# Patient Record
Sex: Female | Born: 1965 | Race: White | Hispanic: No | Smoking: Current every day smoker
Health system: Southern US, Community
[De-identification: ages and names within clinical notes are randomized; demographics above are authoritative.]

## PROBLEM LIST (undated history)

## (undated) DIAGNOSIS — R42 Dizziness and giddiness: Secondary | ICD-10-CM

## (undated) DIAGNOSIS — F3181 Bipolar II disorder: Secondary | ICD-10-CM

## (undated) DIAGNOSIS — F32A Depression, unspecified: Secondary | ICD-10-CM

## (undated) DIAGNOSIS — R945 Abnormal results of liver function studies: Secondary | ICD-10-CM

## (undated) DIAGNOSIS — R7989 Other specified abnormal findings of blood chemistry: Secondary | ICD-10-CM

## (undated) DIAGNOSIS — K219 Gastro-esophageal reflux disease without esophagitis: Secondary | ICD-10-CM

## (undated) HISTORY — PX: APPENDECTOMY: SHX54

## (undated) HISTORY — PX: TUBAL LIGATION: SHX77

## (undated) HISTORY — PX: CARPAL TUNNEL RELEASE: SHX101

---

## 2007-07-06 ENCOUNTER — Emergency Department (HOSPITAL_COMMUNITY): Admission: EM | Admit: 2007-07-06 | Discharge: 2007-07-06 | Payer: Self-pay | Admitting: Emergency Medicine

## 2010-03-02 ENCOUNTER — Emergency Department (HOSPITAL_COMMUNITY): Admission: EM | Admit: 2010-03-02 | Discharge: 2010-03-02 | Payer: Self-pay | Admitting: Emergency Medicine

## 2011-02-03 LAB — URINALYSIS, ROUTINE W REFLEX MICROSCOPIC
Specific Gravity, Urine: 1.012 (ref 1.005–1.030)
pH: 6 (ref 5.0–8.0)

## 2011-08-11 ENCOUNTER — Emergency Department (HOSPITAL_COMMUNITY)
Admission: EM | Admit: 2011-08-11 | Discharge: 2011-08-11 | Disposition: A | Discharge status: Home / Self Care | Payer: Self-pay | Attending: Emergency Medicine | Admitting: Emergency Medicine

## 2011-08-11 ENCOUNTER — Emergency Department (HOSPITAL_COMMUNITY): Payer: Self-pay

## 2011-08-11 DIAGNOSIS — G43909 Migraine, unspecified, not intractable, without status migrainosus: Secondary | ICD-10-CM | POA: Insufficient documentation

## 2011-08-11 DIAGNOSIS — F17200 Nicotine dependence, unspecified, uncomplicated: Secondary | ICD-10-CM | POA: Insufficient documentation

## 2011-08-11 DIAGNOSIS — Z79899 Other long term (current) drug therapy: Secondary | ICD-10-CM | POA: Insufficient documentation

## 2011-08-11 DIAGNOSIS — F411 Generalized anxiety disorder: Secondary | ICD-10-CM | POA: Insufficient documentation

## 2011-08-11 DIAGNOSIS — R079 Chest pain, unspecified: Secondary | ICD-10-CM | POA: Insufficient documentation

## 2011-08-11 LAB — CBC
HCT: 37 % (ref 36.0–46.0)
Hemoglobin: 12.4 g/dL (ref 12.0–15.0)
MCHC: 33.5 g/dL (ref 30.0–36.0)
MCV: 89.4 fL (ref 78.0–100.0)
Platelets: 179 10*3/uL (ref 150–400)

## 2011-08-11 LAB — BASIC METABOLIC PANEL
CO2: 25 mEq/L (ref 19–32)
Calcium: 9 mg/dL (ref 8.4–10.5)
Chloride: 107 mEq/L (ref 96–112)
Potassium: 3.5 mEq/L (ref 3.5–5.1)
Sodium: 139 mEq/L (ref 135–145)

## 2011-08-11 LAB — POCT I-STAT TROPONIN I: Troponin i, poc: 0 ng/mL (ref 0.00–0.08)

## 2011-08-28 LAB — URINALYSIS, ROUTINE W REFLEX MICROSCOPIC
Bilirubin Urine: NEGATIVE
Ketones, ur: NEGATIVE
Nitrite: NEGATIVE
Protein, ur: NEGATIVE
Urobilinogen, UA: 0.2
pH: 6

## 2012-01-06 ENCOUNTER — Emergency Department (HOSPITAL_BASED_OUTPATIENT_CLINIC_OR_DEPARTMENT_OTHER)
Admission: EM | Admit: 2012-01-06 | Discharge: 2012-01-06 | Disposition: A | Discharge status: Home / Self Care | Payer: Self-pay | Attending: Emergency Medicine | Admitting: Emergency Medicine

## 2012-01-06 ENCOUNTER — Encounter (HOSPITAL_BASED_OUTPATIENT_CLINIC_OR_DEPARTMENT_OTHER): Payer: Self-pay | Admitting: *Deleted

## 2012-01-06 ENCOUNTER — Emergency Department (INDEPENDENT_AMBULATORY_CARE_PROVIDER_SITE_OTHER): Payer: Self-pay

## 2012-01-06 ENCOUNTER — Other Ambulatory Visit: Payer: Self-pay

## 2012-01-06 DIAGNOSIS — R9389 Abnormal findings on diagnostic imaging of other specified body structures: Secondary | ICD-10-CM

## 2012-01-06 DIAGNOSIS — J189 Pneumonia, unspecified organism: Secondary | ICD-10-CM

## 2012-01-06 DIAGNOSIS — R059 Cough, unspecified: Secondary | ICD-10-CM | POA: Insufficient documentation

## 2012-01-06 DIAGNOSIS — R0989 Other specified symptoms and signs involving the circulatory and respiratory systems: Secondary | ICD-10-CM

## 2012-01-06 DIAGNOSIS — R05 Cough: Secondary | ICD-10-CM | POA: Insufficient documentation

## 2012-01-06 DIAGNOSIS — R918 Other nonspecific abnormal finding of lung field: Secondary | ICD-10-CM

## 2012-01-06 DIAGNOSIS — R509 Fever, unspecified: Secondary | ICD-10-CM | POA: Insufficient documentation

## 2012-01-06 LAB — BASIC METABOLIC PANEL
CO2: 28 mEq/L (ref 19–32)
Chloride: 105 mEq/L (ref 96–112)
Creatinine, Ser: 0.7 mg/dL (ref 0.50–1.10)
Potassium: 3.9 mEq/L (ref 3.5–5.1)
Sodium: 142 mEq/L (ref 135–145)

## 2012-01-06 LAB — DIFFERENTIAL
Basophils Absolute: 0 10*3/uL (ref 0.0–0.1)
Basophils Relative: 0 % (ref 0–1)
Eosinophils Absolute: 0.1 10*3/uL (ref 0.0–0.7)
Eosinophils Relative: 1 % (ref 0–5)
Monocytes Relative: 10 % (ref 3–12)
Neutro Abs: 3.7 10*3/uL (ref 1.7–7.7)

## 2012-01-06 LAB — CBC
Hemoglobin: 14 g/dL (ref 12.0–15.0)
MCHC: 33.7 g/dL (ref 30.0–36.0)
MCV: 87.8 fL (ref 78.0–100.0)
RBC: 4.74 MIL/uL (ref 3.87–5.11)

## 2012-01-06 MED ORDER — DEXTROSE 5 % IV SOLN
1.0000 g | INTRAVENOUS | Status: DC
  Administered 2012-01-06: 1 g via INTRAVENOUS
  Filled 2012-01-06: qty 10

## 2012-01-06 MED ORDER — AZITHROMYCIN 250 MG PO TABS: ORAL_TABLET | ORAL | Status: DC

## 2012-01-06 MED ORDER — SODIUM CHLORIDE 0.9 % IV SOLN
Freq: Once | INTRAVENOUS | Status: AC
  Administered 2012-01-06: 15:00:00 via INTRAVENOUS

## 2012-01-06 MED ORDER — ONDANSETRON HCL 4 MG/2ML IJ SOLN
4.0000 mg | Freq: Once | INTRAMUSCULAR | Status: AC
  Administered 2012-01-06: 4 mg via INTRAVENOUS
  Filled 2012-01-06: qty 2

## 2012-01-06 MED ORDER — AZITHROMYCIN 500 MG IV SOLR
500.0000 mg | Freq: Once | INTRAVENOUS | Status: AC
  Administered 2012-01-06: 500 mg via INTRAVENOUS
  Filled 2012-01-06: qty 500

## 2012-01-06 MED ORDER — ACETAMINOPHEN 325 MG PO TABS
ORAL_TABLET | ORAL | Status: AC
  Administered 2012-01-06: 650 mg
  Filled 2012-01-06: qty 2

## 2012-01-06 NOTE — ED Provider Notes (Signed)
History     CSN: [id number]  Arrival date & time 01/06/12  1337   None     Chief Complaint  Patient presents with  . URI    (Consider location/radiation/quality/duration/timing/severity/associated sxs/prior treatment) Patient is a 46 y.o. female presenting with cough. The history is provided by the patient. No language interpreter was used.  Cough This is a new problem. The current episode started more than 1 week ago. The problem occurs constantly. The problem has been gradually worsening. The cough is productive of sputum. The maximum temperature recorded prior to her arrival was 100 to 100.9 F. The fever has been present for less than 1 day. She has tried nothing for the symptoms. The treatment provided no relief. Her past medical history does not include bronchitis or pneumonia.  Pt complains of cough and bodyaches.   Pt reports she aches all over.  History reviewed. No pertinent past medical history.  Past Surgical History  Procedure Date  . Appendectomy   . Cesarean section   . Tubal ligation     History reviewed. No pertinent family history.  History  Substance Use Topics  . Smoking status: Never Smoker   . Smokeless tobacco: Not on file  . Alcohol Use: No    OB History    Grav Para Term Preterm Abortions TAB SAB Ect Mult Living                  Review of Systems  Respiratory: Positive for cough.   All other systems reviewed and are negative.    Allergies  Review of patient's allergies indicates no known allergies.  Home Medications  No current outpatient prescriptions on file.  BP 140/85  Pulse 81  Temp(Src) 98.1 F (36.7 C) (Oral)  Resp 16  Ht 5\' 3"  (1.6 m)  Wt 200 lb (90.719 kg)  BMI 35.43 kg/m2  SpO2 100%  LMP 12/23/2011  Physical Exam  Nursing note and vitals reviewed. Constitutional: She appears well-developed and well-nourished.  HENT:  Head: Normocephalic and atraumatic.  Right Ear: External ear normal.  Nose: Nose normal.    Mouth/Throat: Oropharynx is clear and moist.  Eyes: Conjunctivae and EOM are normal. Pupils are equal, round, and reactive to light.  Neck: Normal range of motion. Neck supple.  Cardiovascular: Normal rate and normal heart sounds.   Pulmonary/Chest: Effort normal and breath sounds normal.  Abdominal: Soft. Bowel sounds are normal.  Musculoskeletal: Normal range of motion.  Neurological: She is alert.  Skin: Skin is warm.  Psychiatric: She has a normal mood and affect.    ED Course  Procedures (including critical care time)  Labs Reviewed - No data to display No results found.   No diagnosis found.    MDM  Chest xray shows infiltrate,   Pt given IV Rocephin and zithromax.   Pt advised to see her MD for recheck in 2 days. Pt given rx for zithromax. Pt is advised to return if shortness of breath or fever not relieved by tylenol   Date: 01/06/2012  Rate: 73  Rhythm: normal sinus rhythm  QRS Axis: normal  Intervals: normal  ST/T Wave abnormalities: nonspecific ST changes  Conduction Disutrbances:none  Narrative Interpretation:   Old EKG Reviewed: unchanged       Langston Masker, PA 01/06/12 1815  Langston Masker, [place] 01/06/12 [phone]

## 2012-01-06 NOTE — Discharge Instructions (Signed)

## 2012-01-06 NOTE — ED Notes (Signed)
Pt c/o URI symptoms x 2 weeks.  

## 2012-01-07 NOTE — ED Provider Notes (Signed)
History/physical exam/procedure(s) were performed by non-physician practitioner and as supervising physician I was immediately available for consultation/collaboration. I have reviewed all notes and am in agreement with care and plan.   Hilario Quarry, MD 01/07/12 [phone]

## 2012-02-01 ENCOUNTER — Encounter (HOSPITAL_BASED_OUTPATIENT_CLINIC_OR_DEPARTMENT_OTHER): Payer: Self-pay | Admitting: Family Medicine

## 2012-02-01 ENCOUNTER — Other Ambulatory Visit: Payer: Self-pay

## 2012-02-01 ENCOUNTER — Emergency Department (INDEPENDENT_AMBULATORY_CARE_PROVIDER_SITE_OTHER): Payer: Self-pay

## 2012-02-01 ENCOUNTER — Emergency Department (HOSPITAL_BASED_OUTPATIENT_CLINIC_OR_DEPARTMENT_OTHER)
Admission: EM | Admit: 2012-02-01 | Discharge: 2012-02-01 | Disposition: A | Discharge status: Home Health | Payer: Self-pay | Attending: Emergency Medicine | Admitting: Emergency Medicine

## 2012-02-01 DIAGNOSIS — F17200 Nicotine dependence, unspecified, uncomplicated: Secondary | ICD-10-CM | POA: Insufficient documentation

## 2012-02-01 DIAGNOSIS — R079 Chest pain, unspecified: Secondary | ICD-10-CM | POA: Insufficient documentation

## 2012-02-01 DIAGNOSIS — J40 Bronchitis, not specified as acute or chronic: Secondary | ICD-10-CM | POA: Insufficient documentation

## 2012-02-01 DIAGNOSIS — K2970 Gastritis, unspecified, without bleeding: Secondary | ICD-10-CM | POA: Insufficient documentation

## 2012-02-01 DIAGNOSIS — R0602 Shortness of breath: Secondary | ICD-10-CM

## 2012-02-01 DIAGNOSIS — K2990 Gastroduodenitis, unspecified, without bleeding: Secondary | ICD-10-CM | POA: Insufficient documentation

## 2012-02-01 DIAGNOSIS — K219 Gastro-esophageal reflux disease without esophagitis: Secondary | ICD-10-CM | POA: Insufficient documentation

## 2012-02-01 HISTORY — DX: Gastro-esophageal reflux disease without esophagitis: K21.9

## 2012-02-01 LAB — CARDIAC PANEL(CRET KIN+CKTOT+MB+TROPI)
Relative Index: INVALID (ref 0.0–2.5)
Total CK: 94 U/L (ref 7–177)
Troponin I: <0.3 ng/mL (ref <0.30)

## 2012-02-01 LAB — CBC
MCH: 29.9 pg (ref 26.0–34.0)
MCHC: 34.3 g/dL (ref 30.0–36.0)
MCV: 87.2 fL (ref 78.0–100.0)
Platelets: 196 10*3/uL (ref 150–400)
WBC: 6 10*3/uL (ref 4.0–10.5)

## 2012-02-01 LAB — COMPREHENSIVE METABOLIC PANEL
ALT: 42 U/L — ABNORMAL HIGH (ref 0–35)
Albumin: 4.1 g/dL (ref 3.5–5.2)
CO2: 26 mEq/L (ref 19–32)
Creatinine, Ser: 0.6 mg/dL (ref 0.50–1.10)
GFR calc Af Amer: >90 mL/min (ref >90)
GFR calc non Af Amer: >90 mL/min (ref >90)
Sodium: 140 mEq/L (ref 135–145)

## 2012-02-01 MED ORDER — ALBUTEROL SULFATE HFA 108 (90 BASE) MCG/ACT IN AERS
2.0000 | INHALATION_SPRAY | Freq: Once | RESPIRATORY_TRACT | Status: AC
  Administered 2012-02-01: 2 via RESPIRATORY_TRACT
  Filled 2012-02-01: qty 6.7

## 2012-02-01 MED ORDER — GI COCKTAIL ~~LOC~~
30.0000 mL | Freq: Once | ORAL | Status: AC
  Administered 2012-02-01: 30 mL via ORAL
  Filled 2012-02-01: qty 30

## 2012-02-01 MED ORDER — ASPIRIN 81 MG PO CHEW
324.0000 mg | CHEWABLE_TABLET | Freq: Once | ORAL | Status: AC
  Administered 2012-02-01: 324 mg via ORAL
  Filled 2012-02-01: qty 4

## 2012-02-01 MED ORDER — PANTOPRAZOLE SODIUM 20 MG PO TBEC: 20.0000 mg | DELAYED_RELEASE_TABLET | Freq: Every day | ORAL | Status: AC

## 2012-02-01 NOTE — ED Notes (Signed)
Karen Sofia, PA-C at bedside speaking with pt. 

## 2012-02-01 NOTE — ED Notes (Signed)
Patient ambulatory to the restroom with standby assistance.  Gait steady.

## 2012-02-01 NOTE — ED Notes (Signed)
Family at bedside. 

## 2012-02-01 NOTE — Discharge Instructions (Signed)
Cardiac Biomarkers Cardiac biomarkers are enzymes, proteins, and hormones that are associated with heart function, damage or failure. Some of the tests are specific for the heart while others are also elevated with skeletal muscle damage. Cardiac biomarkers are used for diagnostic and prognostic purposes and are frequently ordered by caregivers when someone comes into the Emergency Room complaining of symptoms, such as chest pain, pressure, nausea, and shortness of breath. These tests are ordered, along with other laboratory and non-laboratory tests, to detect heart failure (which is often a chronic, progressive condition affecting the ability of the heart to fill with blood and pump efficiently) and the acute coronary syndromes (ACS) as well as to help determine prognosis for people who have had a heart attack. ACS is a group of symptoms that reflect a sudden decrease in the amount of blood and oxygen, also termed 'ischemia,' reaching the heart. This decrease is frequently due to either a narrowing of the coronary arteries (atherosclerosis or vessel spasm) or unstable plaques, which can cause a blood clot (thrombus) and blockage of blood flow. If the oxygen supply is low, it can cause angina (pain); if blood flow is reduced, it can cause death of heart cells (called myocardial infarction or heart attack) and can lead to death of the affected heart muscle cells and to permanent damage and scarring of the heart.  The goal with cardiac biomarkers is to be able to detect the presence and severity of an acute heart condition as soon as possible so that appropriate treatment can be initiated.  There are only a few cardiac biomarkers that are being routinely used by physicians. Some have been phased out because they are not as specific as the marker of choice - troponin. Many other potential cardiac biomarkers are still being researched but their clinical utility has yet to be established.  Note: Cardiac biomarkers  are not the same tests as those that are used to screen the general healthy population for their risk of developing heart disease. Those can be found under Cardiac Risk Assessment. LABORATORY TESTS CURRENT CARDIAC BIOMARKERS   CK and CK-MB   BNP or (NT-proBNP)   Troponin   Myoglobin (not always used; sometimes ordered with troponin)  MORE GENERAL TESTS FREQUENTLY ORDERED ALONG WITH CARDIAC BIOMARKERS   Blood Gases   CMP   BMP   Electrolytes   CBC  ON THE HORIZON Ischemia modified albumin (IMA) - Test has received FDA approval for use with troponin and electrocardiogram to rule out acute coronary syndrome (ACS) in patients with chest pain. May become useful for identifying patients at higher risk of heart attack and potentially could replace myoglobin one day.  NON-LABORATORY TESTS These tests allow caregivers to look at the size, shape, and function of the heart as it is beating. They can be used to detect changes to the rhythm of the heart as well as to detect and evaluate damaged tissues and blocked arteries.   EKG (ECG, electrocardiogram)   Coronary angiography (or arteriography)   Stress testing   Nuclear scan   ECG (echocardiogram)   Chest X-ray  THE FOLLOWING SUMMARIZES CURRENTLY USED CARDIAC BIOMARKERS. Marker: CK  What: Enzyme that exists in three different isoforms   Where Found: Heart, brain, and skeletal muscle   What Indicates: Injury to muscle cells   Time to Increase: 4 to 6 hours after injury, peaks in 18 to 24 hours   Time back to Normal: Normal in 48 to 72 hours, unless due to continuing injury  When/How Used: Being phased out, may be ordered prior to CK-MB  Marker: CK-MB  What: Heart- related portion of total CK enzyme   Where Found: Heart primarily, but also in skeletal muscle   What Indicates: Injury (cell death) to heart   Time to Increase: 4 to 6 hrs after heart attack, peaks in 12 to 20 hours   Time back to Normal: Returns to normal  in 24 to 48 hours unless new/continual damage   When/How Used: Not as specific as Troponin for heart injury/attack, may be ordered when Troponin is not available, may be ordered to monitor new/continuing damage  Marker: Myoglobin  What: Small oxygen-storing protein   Where Found: Heart and other muscle cells   What Indicates: Injury to heart or other muscle cells. Also elevated with kidney problems.   Time to Increase: Starts to rise within 2 to 3 hours, peaks in 8 to 12 hours.   Time back to Normal: Falls back to normal by about one day after injury occurred   When/How Used: Ordered along with Troponin, helps diagnose heart injury/attack  Marker: Cardiac Troponin  What: Components of a Regulatory protein complex. Two cardiac specific isoforms: T and I   Where Found: Heart muscle   What Indicates: Heart injury/damage   Time to Increase: 4 to 8 hours   Time back to Normal: Remains elevated for 7 to 14 days   When/How Used: Ordered to help assess prognosis and diagnose heart attack  Marker: LDH  What: Enzyme   Where Found: Almost all body tissues   What Indicates: General marker of injury to cells   When/How Used: Phased out, not specific  Marker: AST  What: Enzyme   Where Found: Almost all body tissues   What Indicates: General marker of injury to cells   When/How Used: Phased out, not specific  Marker: Hs-CRP  What: Protein   Where Found: Associated with athero-sclerosis   What Indicates: Inflammatory process   Time back to Normal: Elevated with inflammation   When/How Used: May help determine prognosis of patients who have had heart attack  Marker: BNP  What: Hormone   Where Found: Heart's left ventricle   What Indicates: Heart failure   Time back to Normal: Elevation related to severity   When/How Used: Help diagnose and evaluate heart failure, prognosis, and to monitor therapy  Document Released: 11/25/2004 Document Revised: 10/22/2011 Document  Reviewed: 08/12/2005 [facility] Patient Information 2012 [place], [place].Bronchitis Bronchitis is the body's way of reacting to injury and/or infection (inflammation) of the bronchi. Bronchi are the air tubes that extend from the windpipe into the lungs. If the inflammation becomes severe, it may cause shortness of breath. CAUSES  Inflammation may be caused by:  A virus.   Germs (bacteria).   Dust.   Allergens.   Pollutants and many other irritants.  The cells lining the bronchial tree are covered with tiny hairs (cilia). These constantly beat upward, away from the lungs, toward the mouth. This keeps the lungs free of pollutants. When these cells become too irritated and are unable to do their job, mucus begins to develop. This causes the characteristic cough of bronchitis. The cough clears the lungs when the cilia are unable to do their job. Without either of these protective mechanisms, the mucus would settle in the lungs. Then you would develop pneumonia. Smoking is a common cause of bronchitis and can contribute to pneumonia. Stopping this habit is the single most important thing you can do to help  yourself. TREATMENT   Your caregiver may prescribe an antibiotic if the cough is caused by bacteria. Also, medicines that open up your airways make it easier to breathe. Your caregiver may also recommend or prescribe an expectorant. It will loosen the mucus to be coughed up. Only take over-the-counter or prescription medicines for pain, discomfort, or fever as directed by your caregiver.   Removing whatever causes the problem (smoking, for example) is critical to preventing the problem from getting worse.   Cough suppressants may be prescribed for relief of cough symptoms.   Inhaled medicines may be prescribed to help with symptoms now and to help prevent problems from returning.   For those with recurrent (chronic) bronchitis, there may be a need for steroid medicines.  SEEK IMMEDIATE  MEDICAL CARE IF:   During treatment, you develop more pus-like mucus (purulent sputum).   You have a fever.   Your baby is older than 3 months with a rectal temperature of 102 F (38.9 C) or higher.   Your baby is 42 months old or younger with a rectal temperature of 100.4 F (38 C) or higher.   You become progressively more ill.   You have increased difficulty breathing, wheezing, or shortness of breath.  It is necessary to seek immediate medical care if you are elderly or sick from any other disease. MAKE SURE YOU:   Understand these instructions.   Will watch your condition.   Will get help right away if you are not doing well or get worse.  Document Released: 11/02/2005 Document Revised: 10/22/2011 Document Reviewed: 09/11/2008 [facility] Patient Information 2012 [place], [place].Chest Pain (Nonspecific) It is often hard to give a specific diagnosis for the cause of chest pain. There is always a chance that your pain could be related to something serious, such as a heart attack or a blood clot in the lungs. You need to follow up with your caregiver for further evaluation. CAUSES   Heartburn.   Pneumonia or bronchitis.   Anxiety or stress.   Inflammation around your heart (pericarditis) or lung (pleuritis or pleurisy).   A blood clot in the lung.   A collapsed lung (pneumothorax). It can develop suddenly on its own (spontaneous pneumothorax) or from injury (trauma) to the chest.   Shingles infection (herpes zoster virus).  The chest wall is composed of bones, muscles, and cartilage. Any of these can be the source of the pain.  The bones can be bruised by injury.   The muscles or cartilage can be strained by coughing or overwork.   The cartilage can be affected by inflammation and become sore (costochondritis).  DIAGNOSIS  Lab tests or other studies, such as X-rays, electrocardiography, stress testing, or cardiac imaging, may be needed to find the cause of your pain.    TREATMENT   Treatment depends on what may be causing your chest pain. Treatment may include:   Acid blockers for heartburn.   Anti-inflammatory medicine.   Pain medicine for inflammatory conditions.   Antibiotics if an infection is present.   You may be advised to change lifestyle habits. This includes stopping smoking and avoiding alcohol, caffeine, and chocolate.   You may be advised to keep your head raised (elevated) when sleeping. This reduces the chance of acid going backward from your stomach into your esophagus.   Most of the time, nonspecific chest pain will improve within 2 to 3 days with rest and mild pain medicine.  HOME CARE INSTRUCTIONS   If antibiotics were  prescribed, take your antibiotics as directed. Finish them even if you start to feel better.   For the next few days, avoid physical activities that bring on chest pain. Continue physical activities as directed.   Do not smoke.   Avoid drinking alcohol.   Only take over-the-counter or prescription medicine for pain, discomfort, or fever as directed by your caregiver.   Follow your caregiver's suggestions for further testing if your chest pain does not go away.   Keep any follow-up appointments you made. If you do not go to an appointment, you could develop lasting (chronic) problems with pain. If there is any problem keeping an appointment, you must call to reschedule.  SEEK MEDICAL CARE IF:   You think you are having problems from the medicine you are taking. Read your medicine instructions carefully.   Your chest pain does not go away, even after treatment.   You develop a rash with blisters on your chest.  SEEK IMMEDIATE MEDICAL CARE IF:   You have increased chest pain or pain that spreads to your arm, neck, jaw, back, or abdomen.   You develop shortness of breath, an increasing cough, or you are coughing up blood.   You have severe back or abdominal pain, feel nauseous, or vomit.   You develop  severe weakness, fainting, or chills.   You have a fever.  THIS IS AN EMERGENCY. Do not wait to see if the pain will go away. Get medical help at once. Call your local emergency services (911 in [place] Do not drive yourself to the hospital. MAKE SURE YOU:   Understand these instructions.   Will watch your condition.   Will get help right away if you are not doing well or get worse.  Document Released: 08/12/2005 Document Revised: 10/22/2011 Document Reviewed: 06/07/2008 [facility] Patient Information 2012 [place], [place].

## 2012-02-01 NOTE — ED Provider Notes (Signed)
History     CSN: [id number]  Arrival date & time 02/01/12  1148   First MD Initiated Contact with Patient 02/01/12 1240      Chief Complaint  Patient presents with  . Chest Pain    (Consider location/radiation/quality/duration/timing/severity/associated sxs/prior treatment) Patient is a 46 y.o. female presenting with chest pain. The history is provided by the patient. No language interpreter was used.  Chest Pain The chest pain began more than 2 weeks ago. Chest pain occurs intermittently. The chest pain is unchanged. The pain is associated with exertion. At its most intense, the pain is at 10/10. The pain is currently at 10/10. The severity of the pain is severe. The quality of the pain is described as aching. The pain does not radiate. Chest pain is worsened by certain positions.   The patient reports she began having pain in her chest 2 weeks ago.  Pt reports she has a bubbling sensationon left side.  Pt complains of tightness in mid chest on and off for 2 weeks.  Pt reports she was anxious about the drive here and pain was worse.  Pt reports her daughters driving upsets her  Past Medical History  Diagnosis Date  . GERD (gastroesophageal reflux disease)     Past Surgical History  Procedure Date  . Appendectomy   . Cesarean section   . Tubal ligation     No family history on file.  History  Substance Use Topics  . Smoking status: Current Everyday Smoker  . Smokeless tobacco: Not on file  . Alcohol Use: No    OB History    Grav Para Term Preterm Abortions TAB SAB Ect Mult Living                  Review of Systems  Cardiovascular: Positive for chest pain.  All other systems reviewed and are negative.    Allergies  Review of patient's allergies indicates no known allergies.  Home Medications   Current Outpatient Rx  Name Route Sig Dispense Refill  . LEXAPRO PO Oral Take by mouth.    Marland Kitchen NAPROSYN PO Oral Take by mouth.    . ACETAMINOPHEN 325 MG PO TABS Oral  Take 650 mg by mouth every 6 (six) hours as needed. Patient used this medication for pain.    Marland Kitchen AZITHROMYCIN 250 MG PO TABS  1 every day until finished. 4 tablet 0  . DM-GUAIFENESIN ER 30-600 MG PO TB12 Oral Take 1 tablet by mouth every 12 (twelve) hours. Patient used this medication for sinus congestion.    . IBUPROFEN 200 MG PO TABS Oral Take 400 mg by mouth every 6 (six) hours as needed. Patient used this medication for pain.    Marland Kitchen SODIUM & POTASSIUM BICARBONATE PO TBEF Oral Take 2 tablets by mouth daily as needed. Patient used this medication for cold.    Marland Kitchen SUDAFED SINUS NIGHTTIME PO Oral Take 2 tablets by mouth daily as needed. Patient used this medication for sinus congestion.      BP 147/85  Pulse 72  Temp(Src) 98.2 F (36.8 C) (Oral)  Resp 16  Ht 5\' 3"  (1.6 m)  Wt 196 lb (88.905 kg)  BMI 34.72 kg/m2  SpO2 99%  LMP 01/07/2012  Physical Exam  Nursing note and vitals reviewed. Constitutional: She appears well-developed and well-nourished.  HENT:  Head: Normocephalic and atraumatic.  Eyes: Conjunctivae and EOM are normal. Pupils are equal, round, and reactive to light.  Neck: Normal range of motion. Neck  supple.  Cardiovascular: Normal rate.   Pulmonary/Chest: Effort normal.  Abdominal: Soft.  Musculoskeletal: Normal range of motion.  Neurological: She is alert.  Skin: Skin is warm.  Psychiatric: She has a normal mood and affect.    ED Course  Procedures (including critical care time)  Labs Reviewed  COMPREHENSIVE METABOLIC PANEL - Abnormal; Notable for the following:    Glucose, Bld 101 (*)    ALT 42 (*)    All other components within normal limits  CBC  CARDIAC PANEL(CRET KIN+CKTOT+MB+TROPI)   Dg Chest 2 View  02/01/2012  *RADIOLOGY REPORT*  Clinical Data: Chest pain and shortness of breath  CHEST - 2 VIEW  Comparison: 01/14/2012  Findings: Artifact overlies chest.  Heart size is normal. Mediastinal shadows are normal.  There may be mild central bronchial  thickening but there is no infiltrate, collapse or effusion.  No significant bony finding.  IMPRESSION: No pneumonia or collapse.  Possible central bronchial thickening.  Original Report Authenticated By: Thomasenia Sales, M.D.     No diagnosis found.    MDM      Results for orders placed during the hospital encounter of 02/01/12  CBC      Component Value Range   WBC 6.0  4.0 - 10.5 (K/uL)   RBC 4.61  3.87 - 5.11 (MIL/uL)   Hemoglobin 13.8  12.0 - 15.0 (g/dL)   HCT 16.1  09.6 - 04.5 (%)   MCV 87.2  78.0 - 100.0 (fL)   MCH 29.9  26.0 - 34.0 (pg)   MCHC 34.3  30.0 - 36.0 (g/dL)   RDW 40.9  81.1 - 91.4 (%)   Platelets 196  150 - 400 (K/uL)  COMPREHENSIVE METABOLIC PANEL      Component Value Range   Sodium 140  135 - 145 (mEq/L)   Potassium 4.1  3.5 - 5.1 (mEq/L)   Chloride 105  96 - 112 (mEq/L)   CO2 26  19 - 32 (mEq/L)   Glucose, Bld 101 (*) 70 - 99 (mg/dL)   BUN 15  6 - 23 (mg/dL)   Creatinine, Ser 7.82  0.50 - 1.10 (mg/dL)   Calcium 9.7  8.4 - 95.6 (mg/dL)   Total Protein 7.0  6.0 - 8.3 (g/dL)   Albumin 4.1  3.5 - 5.2 (g/dL)   AST 36  0 - 37 (U/L)   ALT 42 (*) 0 - 35 (U/L)   Alkaline Phosphatase 96  39 - 117 (U/L)   Total Bilirubin 0.3  0.3 - 1.2 (mg/dL)   GFR calc non Af Amer >90  >90 (mL/min)   GFR calc Af Amer >90  >90 (mL/min)  CARDIAC PANEL(CRET KIN+CKTOT+MB+TROPI)      Component Value Range   Total CK 94  7 - 177 (U/L)   CK, MB 1.5  0.3 - 4.0 (ng/mL)   Troponin I <0.30  <0.30 (ng/mL)   Relative Index RELATIVE INDEX IS INVALID  0.0 - 2.5   D-DIMER, QUANTITATIVE      Component Value Range   D-Dimer, Quant <0.22  0.00 - 0.48 (ug/mL-FEU)   Dg Chest 2 View  02/01/2012  *RADIOLOGY REPORT*  Clinical Data: Chest pain and shortness of breath  CHEST - 2 VIEW  Comparison: 01/14/2012  Findings: Artifact overlies chest.  Heart size is normal. Mediastinal shadows are normal.  There may be mild central bronchial thickening but there is no infiltrate, collapse or effusion.   No significant bony finding.  IMPRESSION: No pneumonia or collapse.  Possible central bronchial thickening.  Original Report Authenticated By: Thomasenia Sales, M.D.   Dg Chest 2 View  01/06/2012  *RADIOLOGY REPORT*  Clinical Data: Coughing.  Congestion.  Fever.  Previous history given of smoking.  CHEST - 2 VIEW  Comparison: 08/11/2011.  Findings: Cardiac silhouette is upper range normal size.  It is stable.  Mediastinal and hilar contours appear stable.  There is slight elevation of the right hemidiaphragm.  On the PA examination appears to be patchy infiltrate in the right cardiophrenic angle region.  No consolidation is seen.  On the lateral image there is slight increase in perihilar markings with minimal central peribronchial thickening.  No pleural effusion is seen.  There is minimal degenerative spondylosis.  IMPRESSION: Patchy infiltrate in right cardiophrenic angle region with increase in perihilar markings and central peribronchial thickening.  This may be associated with bronchitis, asthma, and reactive airway disease.  No consolidation or pleural effusion is evident.  Original Report Authenticated By: Crawford Givens, M.D.    Pt given Gi cocktail.  I will start prilosec.   I also gave pt an albuterol inhaler,  I advised stop smoking.      Lonia Skinner [place], [place] 02/01/12 2022

## 2012-02-01 NOTE — ED Notes (Signed)
Pt c/o central chest pain intermittent and present with movement. Pt sts pain feels like pins and needles. Pt sts she has reflux and gas "problems". Pt sts she also took a xanax without relief. Pt denies pain at present and sts "it was really bad on the drive here because the traffic was so bad", pt questions whether it might be "stress related".

## 2012-02-02 NOTE — ED Provider Notes (Signed)
Medical screening examination/treatment/procedure(s) were performed by non-physician practitioner and as supervising physician I was immediately available for consultation/collaboration.   Clemon Devaul, MD 02/02/12 0703 

## 2013-02-13 ENCOUNTER — Emergency Department (HOSPITAL_BASED_OUTPATIENT_CLINIC_OR_DEPARTMENT_OTHER)
Admission: EM | Admit: 2013-02-13 | Discharge: 2013-02-13 | Disposition: A | Discharge status: Home / Self Care | Payer: Self-pay | Attending: Emergency Medicine | Admitting: Emergency Medicine

## 2013-02-13 ENCOUNTER — Encounter (HOSPITAL_BASED_OUTPATIENT_CLINIC_OR_DEPARTMENT_OTHER): Payer: Self-pay | Admitting: *Deleted

## 2013-02-13 DIAGNOSIS — K219 Gastro-esophageal reflux disease without esophagitis: Secondary | ICD-10-CM | POA: Insufficient documentation

## 2013-02-13 DIAGNOSIS — J3489 Other specified disorders of nose and nasal sinuses: Secondary | ICD-10-CM | POA: Insufficient documentation

## 2013-02-13 DIAGNOSIS — R42 Dizziness and giddiness: Secondary | ICD-10-CM | POA: Insufficient documentation

## 2013-02-13 DIAGNOSIS — F17200 Nicotine dependence, unspecified, uncomplicated: Secondary | ICD-10-CM | POA: Insufficient documentation

## 2013-02-13 DIAGNOSIS — H7290 Unspecified perforation of tympanic membrane, unspecified ear: Secondary | ICD-10-CM | POA: Insufficient documentation

## 2013-02-13 DIAGNOSIS — H7291 Unspecified perforation of tympanic membrane, right ear: Secondary | ICD-10-CM

## 2013-02-13 DIAGNOSIS — Z79899 Other long term (current) drug therapy: Secondary | ICD-10-CM | POA: Insufficient documentation

## 2013-02-13 MED ORDER — DIAZEPAM 10 MG PO TABS: 10.0000 mg | ORAL_TABLET | Freq: Four times a day (QID) | ORAL | Status: DC | PRN

## 2013-02-13 MED ORDER — DIAZEPAM 5 MG PO TABS
10.0000 mg | ORAL_TABLET | Freq: Once | ORAL | Status: AC
  Administered 2013-02-13: 10 mg via ORAL
  Filled 2013-02-13: qty 2

## 2013-02-13 NOTE — ED Provider Notes (Addendum)
History    This chart was scribed for Gwyneth Sprout, MD by Marlyne Beards, ED Scribe. The patient was seen in room MH10/MH10. Patient's care was started at 5:53 PM.    CSN: [id number]  Arrival date & time 02/13/13  1732   First MD Initiated Contact with Patient 02/13/13 1753      Chief Complaint  Patient presents with  . Shortness of Breath    (Consider location/radiation/quality/duration/timing/severity/associated sxs/prior treatment) The history is provided by the patient. No language interpreter was used.   Wendy Moon is a 47 y.o. female who presents to the Emergency Department complaining of moderate constant dizziness onset 2 weeks ago . Pt states that the dizziness is like being "on a merry go round and seesaw at the same time". Pt states that the dizziness is tolerable but is affecting her negatively. She complains that rotating her head exacerbates the dizziness. Pt has some associated congestion with the dizziness and states that the left side of her face is swollen. Pt states that she is worried that what she is experiencing is vertigo. Pt starts a new job tomorrow and is worried that something might happen if she goes due to the sx's she having. Pt is currently taking Meclizine with no immediate relief for the dizziness and uses nasal spray for congestion. Pt went to the hospital in [facility] last Thursday where nothing positive was found. Pt denies fever, chills, cough, nausea, vomiting, diarrhea, SOB, weakness, and any other associated symptoms.   Past Medical History  Diagnosis Date  . GERD (gastroesophageal reflux disease)     Past Surgical History  Procedure Laterality Date  . Appendectomy    . Cesarean section    . Tubal ligation      No family history on file.  History  Substance Use Topics  . Smoking status: Current Every Day Smoker -- 0.50 packs/day    Types: Cigarettes  . Smokeless tobacco: Not on file  . Alcohol Use: No    OB History   Grav  Para Term Preterm Abortions TAB SAB Ect Mult Living                  Review of Systems  HENT: Positive for congestion.   Neurological: Positive for dizziness.  All other systems reviewed and are negative.    Allergies  Review of patient's allergies indicates no known allergies.  Home Medications   Current Outpatient Rx  Name  Route  Sig  Dispense  Refill  . AMOXICILLIN PO   Oral   Take by mouth.         . MECLIZINE HCL PO   Oral   Take by mouth.         Marland Kitchen acetaminophen (TYLENOL) 325 MG tablet   Oral   Take 650 mg by mouth every 6 (six) hours as needed. Patient used this medication for pain.         Marland Kitchen azithromycin (ZITHROMAX) 250 MG tablet      1 every day until finished.   4 tablet   0   . dextromethorphan-guaiFENesin (MUCINEX DM) 30-600 MG per 12 hr tablet   Oral   Take 1 tablet by mouth every 12 (twelve) hours. Patient used this medication for sinus congestion.         . Escitalopram Oxalate (LEXAPRO PO)   Oral   Take by mouth.         Marland Kitchen ibuprofen (ADVIL,MOTRIN) 200 MG tablet   Oral  Take 400 mg by mouth every 6 (six) hours as needed. Patient used this medication for pain.         . Naproxen (NAPROSYN PO)   Oral   Take by mouth.         . sodium-potassium bicarbonate (ALKA-SELTZER GOLD) TBEF   Oral   Take 2 tablets by mouth daily as needed. Patient used this medication for cold.         . Triprolidine-Pseudoephedrine (SUDAFED SINUS NIGHTTIME PO)   Oral   Take 2 tablets by mouth daily as needed. Patient used this medication for sinus congestion.           BP 133/87  Pulse 80  Temp(Src) 98.6 F (37 C) (Oral)  Resp 20  Wt 202 lb (91.627 kg)  BMI 35.79 kg/m2  SpO2 95%  Physical Exam  Nursing note and vitals reviewed. Constitutional: She is oriented to person, place, and time. She appears well-developed and well-nourished. No distress.  HENT:  Head: Normocephalic and atraumatic.  Right Ear: Hearing and external ear normal.   Left Ear: Hearing, tympanic membrane and external ear normal.  Ears:  Mouth/Throat: No oropharyngeal exudate.  Pt has pin sized hole in her right TM.  Eyes: Conjunctivae and EOM are normal. Pupils are equal, round, and reactive to light.  Neck: Normal range of motion. Neck supple. No tracheal deviation present.  Cardiovascular: Normal rate, regular rhythm, normal heart sounds and intact distal pulses.   No murmur heard. Pulmonary/Chest: Effort normal. No respiratory distress. She has no wheezes.  Abdominal: Soft. Bowel sounds are normal. There is no tenderness. There is no guarding.  Musculoskeletal: Normal range of motion.  Neurological: She is alert and oriented to person, place, and time. She has normal strength. No cranial nerve deficit or sensory deficit. Coordination normal.  No nystagmus  Skin: Skin is warm and dry.  Psychiatric: She has a normal mood and affect. Her behavior is normal.    ED Course  Procedures (including critical care time) DIAGNOSTIC STUDIES: Oxygen Saturation is 95% on room air, adequate by my interpretation.    COORDINATION OF CARE: 6:12 PM Discussed ED treatment with pt and pt agrees.     Labs Reviewed - No data to display No results found.   1. Ruptured tympanic membrane, right   2. Vertigo       MDM   Pt with sx most consistent with peripheral vertigo.  No systemic or infectious sx.  Normal neuro exam without weakness, ataxia or cerebellar findings on exam.  Normal vision.  Sx are reproducible with movement of the head and attempting to walk.  No hx of Stroke and low likelihood as pt was seen at [facility] on Thursday and had labs, head CT and MRI which were all neg.  No risk factors and normal VS.  Pt states she is taking the meclizine without improvement.  On exam pt does have hole in the TM which is most likely the cause of her vertigo.  No other abnormalities on exam except sx worse when head to the right.  Will give valium to see if she gets  improvement in sx and referral to ENT.  She has been using nasal sprays without improvement.  7:26 PM Pt improved with valium.  Will refer to ENT and give ppx for valium.  I personally performed the services described in this documentation, which was scribed in my presence.  The recorded information has been reviewed and considered.  Gwyneth Sprout, MD 02/13/13 Rickey Primus  Gwyneth Sprout, MD 02/13/13 [phone]

## 2013-02-13 NOTE — ED Notes (Addendum)
Sob. Dizziness. States she feels like she has a fever and her heart is beating fast. States she was seen for same 4 days ago for same and treated for inner ear with Meclizine and started on Amoxicillin.

## 2013-02-13 NOTE — ED Notes (Signed)
Pt. Reports she was seen last thurs and was told she has vertigo.  Pt. Reports she had a CT of her head and a chest x ray done and all came back ok.  Pt. Reports she is still very dizzy.  Pt. Repots her friends brought her here today.    Pt. Did walk a steady gait to room #10.

## 2013-04-04 ENCOUNTER — Encounter (HOSPITAL_BASED_OUTPATIENT_CLINIC_OR_DEPARTMENT_OTHER): Payer: Self-pay

## 2013-04-04 ENCOUNTER — Emergency Department (HOSPITAL_BASED_OUTPATIENT_CLINIC_OR_DEPARTMENT_OTHER): Payer: Self-pay

## 2013-04-04 ENCOUNTER — Emergency Department (HOSPITAL_BASED_OUTPATIENT_CLINIC_OR_DEPARTMENT_OTHER)
Admission: EM | Admit: 2013-04-04 | Discharge: 2013-04-04 | Disposition: A | Discharge status: Home / Self Care | Payer: Self-pay | Attending: Emergency Medicine | Admitting: Emergency Medicine

## 2013-04-04 DIAGNOSIS — R059 Cough, unspecified: Secondary | ICD-10-CM | POA: Insufficient documentation

## 2013-04-04 DIAGNOSIS — R5381 Other malaise: Secondary | ICD-10-CM | POA: Insufficient documentation

## 2013-04-04 DIAGNOSIS — R630 Anorexia: Secondary | ICD-10-CM | POA: Insufficient documentation

## 2013-04-04 DIAGNOSIS — F17200 Nicotine dependence, unspecified, uncomplicated: Secondary | ICD-10-CM | POA: Insufficient documentation

## 2013-04-04 DIAGNOSIS — R531 Weakness: Secondary | ICD-10-CM

## 2013-04-04 DIAGNOSIS — Z79899 Other long term (current) drug therapy: Secondary | ICD-10-CM | POA: Insufficient documentation

## 2013-04-04 DIAGNOSIS — R05 Cough: Secondary | ICD-10-CM | POA: Insufficient documentation

## 2013-04-04 DIAGNOSIS — R6883 Chills (without fever): Secondary | ICD-10-CM | POA: Insufficient documentation

## 2013-04-04 DIAGNOSIS — Z8719 Personal history of other diseases of the digestive system: Secondary | ICD-10-CM | POA: Insufficient documentation

## 2013-04-04 DIAGNOSIS — Z3202 Encounter for pregnancy test, result negative: Secondary | ICD-10-CM | POA: Insufficient documentation

## 2013-04-04 LAB — URINALYSIS, ROUTINE W REFLEX MICROSCOPIC
Glucose, UA: NEGATIVE mg/dL
Leukocytes, UA: NEGATIVE
Specific Gravity, Urine: 1.007 (ref 1.005–1.030)
pH: 7 (ref 5.0–8.0)

## 2013-04-04 LAB — CBC WITH DIFFERENTIAL/PLATELET
Lymphocytes Relative: 36 % (ref 12–46)
Lymphs Abs: 3.2 10*3/uL (ref 0.7–4.0)
MCV: 88.4 fL (ref 78.0–100.0)
Neutrophils Relative %: 54 % (ref 43–77)
Platelets: 166 10*3/uL (ref 150–400)
RBC: 4.58 MIL/uL (ref 3.87–5.11)
WBC: 8.9 10*3/uL (ref 4.0–10.5)

## 2013-04-04 LAB — PREGNANCY, URINE: Preg Test, Ur: NEGATIVE

## 2013-04-04 LAB — COMPREHENSIVE METABOLIC PANEL
Albumin: 3.8 g/dL (ref 3.5–5.2)
BUN: 10 mg/dL (ref 6–23)
Chloride: 103 mEq/L (ref 96–112)
Creatinine, Ser: 0.7 mg/dL (ref 0.50–1.10)
GFR calc non Af Amer: >90 mL/min (ref >90)
Total Bilirubin: 0.1 mg/dL — ABNORMAL LOW (ref 0.3–1.2)

## 2013-04-04 MED ORDER — FENTANYL CITRATE 0.05 MG/ML IJ SOLN: 50.0000 ug | Freq: Once | INTRAMUSCULAR | Status: DC

## 2013-04-04 NOTE — ED Notes (Addendum)
C/o fatigue, weakness x 5 days-states has been worse since staring work 7 weeks ago-pt with steady gait to triage

## 2013-04-04 NOTE — ED Provider Notes (Signed)
History     CSN: [id number]  Arrival date & time 04/04/13  1814   First MD Initiated Contact with Patient 04/04/13 1928      Chief Complaint  Patient presents with  . Fatigue    (Consider location/radiation/quality/duration/timing/severity/associated sxs/prior treatment) HPI Complains of generalized weakness and fatigue for approximately one week. No fever. Admits to diminished appetite. Also describes "a bubbling in my right ear" for the past several days. Denies vertigo. Last bowel movement 2 days ago, normal denies pain anywhere. Other symptoms include cough for one month, unchanged. No shortness of breath no fever admits to cold chills and cold sweats 2 nights ago. No treatment prior to coming here. Nothing makes symptoms better or worse. Patient ate a suburban sandwich today and one yesterday Past Medical History  Diagnosis Date  . GERD (gastroesophageal reflux disease)     Past Surgical History  Procedure Laterality Date  . Appendectomy    . Cesarean section    . Tubal ligation      No family history on file.  History  Substance Use Topics  . Smoking status: Current Every Day Smoker -- 0.50 packs/day    Types: Cigarettes  . Smokeless tobacco: Not on file  . Alcohol Use: No    OB History   Grav Para Term Preterm Abortions TAB SAB Ect Mult Living                  Review of Systems  Constitutional: Positive for chills, appetite change and fatigue.  HENT: Negative.   Respiratory: Positive for cough.   Cardiovascular: Negative.   Gastrointestinal: Negative.   Musculoskeletal: Negative.   Skin: Negative.   Neurological: Negative.   Psychiatric/Behavioral: Negative.   All other systems reviewed and are negative.    Allergies  Review of patient's allergies indicates no known allergies.  Home Medications   Current Outpatient Rx  Name  Route  Sig  Dispense  Refill  . acetaminophen (TYLENOL) 325 MG tablet   Oral   Take 650 mg by mouth every 6 (six) hours  as needed. Patient used this medication for pain.         Marland Kitchen AMOXICILLIN PO   Oral   Take by mouth.         Marland Kitchen azithromycin (ZITHROMAX) 250 MG tablet      1 every day until finished.   4 tablet   0   . dextromethorphan-guaiFENesin (MUCINEX DM) 30-600 MG per 12 hr tablet   Oral   Take 1 tablet by mouth every 12 (twelve) hours. Patient used this medication for sinus congestion.         . diazepam (VALIUM) 10 MG tablet   Oral   Take 1 tablet (10 mg total) by mouth every 6 (six) hours as needed (vertigo).   25 tablet   0   . Escitalopram Oxalate (LEXAPRO PO)   Oral   Take by mouth.         Marland Kitchen ibuprofen (ADVIL,MOTRIN) 200 MG tablet   Oral   Take 400 mg by mouth every 6 (six) hours as needed. Patient used this medication for pain.         . MECLIZINE HCL PO   Oral   Take by mouth.         . Naproxen (NAPROSYN PO)   Oral   Take by mouth.         . sodium-potassium bicarbonate (ALKA-SELTZER GOLD) TBEF   Oral   Take 2  tablets by mouth daily as needed. Patient used this medication for cold.         . Triprolidine-Pseudoephedrine (SUDAFED SINUS NIGHTTIME PO)   Oral   Take 2 tablets by mouth daily as needed. Patient used this medication for sinus congestion.           BP 156/86  Pulse 61  Temp(Src) 98.5 F (36.9 C) (Oral)  Resp 16  Ht 5\' 3"  (1.6 m)  Wt 187 lb (84.823 kg)  BMI 33.13 kg/m2  SpO2 96%  Physical Exam  Nursing note and vitals reviewed. Constitutional: She is oriented to person, place, and time. She appears well-developed and well-nourished.  HENT:  Head: Normocephalic and atraumatic.  Eyes: Conjunctivae are normal. Pupils are equal, round, and reactive to light.  Neck: Neck supple. No tracheal deviation present. No thyromegaly present.  Cardiovascular: Normal rate and regular rhythm.   No murmur heard. Pulmonary/Chest: Effort normal and breath sounds normal.  Abdominal: Soft. Bowel sounds are normal. She exhibits no distension. There  is no tenderness.  Musculoskeletal: Normal range of motion. She exhibits no edema and no tenderness.  Neurological: She is alert and oriented to person, place, and time. She displays normal reflexes. No cranial nerve deficit. Coordination normal.  gait normal  Skin: Skin is warm and dry. No rash noted.  Psychiatric: She has a normal mood and affect.    ED Course  Procedures (including critical care time)  Labs Reviewed - No data to display No results found.  9:45 PM patient resting comfortably, in no distress No diagnosis found.   Date: 04/04/2013  Rate: 55  Rhythm: sinus bradycardia  QRS Axis: normal  Intervals: normal  ST/T Wave abnormalities: normal  Conduction Disutrbances:none  Narrative Interpretation: Poor R-wave progression  Old EKG Reviewed: Sinus bradycardia new from 02/01/2012 otherwise unchanged interpreted by me  Chest x-ray viewed by me  Results for orders placed during the hospital encounter of 04/04/13  COMPREHENSIVE METABOLIC PANEL      Result Value Range   Sodium 139  135 - 145 mEq/L   Potassium 3.8  3.5 - 5.1 mEq/L   Chloride 103  96 - 112 mEq/L   CO2 26  19 - 32 mEq/L   Glucose, Bld 94  70 - 99 mg/dL   BUN 10  6 - 23 mg/dL   Creatinine, Ser 7.82  0.50 - 1.10 mg/dL   Calcium 9.8  8.4 - 95.6 mg/dL   Total Protein 6.8  6.0 - 8.3 g/dL   Albumin 3.8  3.5 - 5.2 g/dL   AST 19  0 - 37 U/L   ALT 20  0 - 35 U/L   Alkaline Phosphatase 89  39 - 117 U/L   Total Bilirubin 0.1 (*) 0.3 - 1.2 mg/dL   GFR calc non Af Amer >90  >90 mL/min   GFR calc Af Amer >90  >90 mL/min  CBC WITH DIFFERENTIAL      Result Value Range   WBC 8.9  4.0 - 10.5 K/uL   RBC 4.58  3.87 - 5.11 MIL/uL   Hemoglobin 14.0  12.0 - 15.0 g/dL   HCT 21.3  08.6 - 57.8 %   MCV 88.4  78.0 - 100.0 fL   MCH 30.6  26.0 - 34.0 pg   MCHC 34.6  30.0 - 36.0 g/dL   RDW 46.9  62.9 - 52.8 %   Platelets 166  150 - 400 K/uL   Neutrophils Relative % 54  43 - 77 %  Neutro Abs 4.8  1.7 - 7.7 K/uL    Lymphocytes Relative 36  12 - 46 %   Lymphs Abs 3.2  0.7 - 4.0 K/uL   Monocytes Relative 7  3 - 12 %   Monocytes Absolute 0.7  0.1 - 1.0 K/uL   Eosinophils Relative 2  0 - 5 %   Eosinophils Absolute 0.2  0.0 - 0.7 K/uL   Basophils Relative 0  0 - 1 %   Basophils Absolute 0.0  0.0 - 0.1 K/uL  URINALYSIS, ROUTINE W REFLEX MICROSCOPIC      Result Value Range   Color, Urine YELLOW  YELLOW   APPearance CLEAR  CLEAR   Specific Gravity, Urine 1.007  1.005 - 1.030   pH 7.0  5.0 - 8.0   Glucose, UA NEGATIVE  NEGATIVE mg/dL   Hgb urine dipstick NEGATIVE  NEGATIVE   Bilirubin Urine NEGATIVE  NEGATIVE   Ketones, ur NEGATIVE  NEGATIVE mg/dL   Protein, ur NEGATIVE  NEGATIVE mg/dL   Urobilinogen, UA 0.2  0.0 - 1.0 mg/dL   Nitrite NEGATIVE  NEGATIVE   Leukocytes, UA NEGATIVE  NEGATIVE  PREGNANCY, URINE      Result Value Range   Preg Test, Ur NEGATIVE  NEGATIVE  TROPONIN I      Result Value Range   Troponin I <0.30  <0.30 ng/mL   Dg Chest 2 View  04/04/2013   *RADIOLOGY REPORT*  Clinical Data: Cough and fatigue.  CHEST - 2 VIEW  Comparison: 02/01/2012.  Findings: The cardiac silhouette, mediastinal and hilar contours are normal and stable.  The lungs are clear.  No pleural effusion. The bony thorax is intact.  IMPRESSION: No acute cardiopulmonary findings.   Original Report Authenticated By: Rudie Meyer, M.D.     MDM  Plan Referral resource guide.  Councilled pt for 5 minutes on smoking cessation  Dx : non specific weakness      Doug Sou, MD 04/04/13 2158

## 2013-05-29 ENCOUNTER — Emergency Department (HOSPITAL_BASED_OUTPATIENT_CLINIC_OR_DEPARTMENT_OTHER)
Admission: EM | Admit: 2013-05-29 | Discharge: 2013-05-29 | Disposition: A | Discharge status: Home / Self Care | Payer: Self-pay | Attending: Emergency Medicine | Admitting: Emergency Medicine

## 2013-05-29 ENCOUNTER — Emergency Department (HOSPITAL_BASED_OUTPATIENT_CLINIC_OR_DEPARTMENT_OTHER): Payer: Self-pay

## 2013-05-29 ENCOUNTER — Encounter (HOSPITAL_BASED_OUTPATIENT_CLINIC_OR_DEPARTMENT_OTHER): Payer: Self-pay | Admitting: *Deleted

## 2013-05-29 DIAGNOSIS — Z79899 Other long term (current) drug therapy: Secondary | ICD-10-CM | POA: Insufficient documentation

## 2013-05-29 DIAGNOSIS — Z792 Long term (current) use of antibiotics: Secondary | ICD-10-CM | POA: Insufficient documentation

## 2013-05-29 DIAGNOSIS — R5381 Other malaise: Secondary | ICD-10-CM | POA: Insufficient documentation

## 2013-05-29 DIAGNOSIS — R42 Dizziness and giddiness: Secondary | ICD-10-CM | POA: Insufficient documentation

## 2013-05-29 DIAGNOSIS — R0609 Other forms of dyspnea: Secondary | ICD-10-CM | POA: Insufficient documentation

## 2013-05-29 DIAGNOSIS — R0602 Shortness of breath: Secondary | ICD-10-CM | POA: Insufficient documentation

## 2013-05-29 DIAGNOSIS — R05 Cough: Secondary | ICD-10-CM | POA: Insufficient documentation

## 2013-05-29 DIAGNOSIS — R0989 Other specified symptoms and signs involving the circulatory and respiratory systems: Secondary | ICD-10-CM | POA: Insufficient documentation

## 2013-05-29 DIAGNOSIS — R0600 Dyspnea, unspecified: Secondary | ICD-10-CM

## 2013-05-29 DIAGNOSIS — Z8719 Personal history of other diseases of the digestive system: Secondary | ICD-10-CM | POA: Insufficient documentation

## 2013-05-29 DIAGNOSIS — R059 Cough, unspecified: Secondary | ICD-10-CM | POA: Insufficient documentation

## 2013-05-29 DIAGNOSIS — F17200 Nicotine dependence, unspecified, uncomplicated: Secondary | ICD-10-CM | POA: Insufficient documentation

## 2013-05-29 LAB — COMPREHENSIVE METABOLIC PANEL
ALT: 45 U/L — ABNORMAL HIGH (ref 0–35)
AST: 27 U/L (ref 0–37)
Alkaline Phosphatase: 125 U/L — ABNORMAL HIGH (ref 39–117)
CO2: 25 mEq/L (ref 19–32)
GFR calc Af Amer: >90 mL/min (ref >90)
Glucose, Bld: 98 mg/dL (ref 70–99)
Potassium: 4.3 mEq/L (ref 3.5–5.1)
Sodium: 137 mEq/L (ref 135–145)
Total Protein: 7.9 g/dL (ref 6.0–8.3)

## 2013-05-29 LAB — CBC WITH DIFFERENTIAL/PLATELET
Basophils Absolute: 0 10*3/uL (ref 0.0–0.1)
Eosinophils Absolute: 0.1 10*3/uL (ref 0.0–0.7)
Lymphocytes Relative: 30 % (ref 12–46)
Lymphs Abs: 2.5 10*3/uL (ref 0.7–4.0)
Neutrophils Relative %: 63 % (ref 43–77)
Platelets: 239 10*3/uL (ref 150–400)
RBC: 4.93 MIL/uL (ref 3.87–5.11)
WBC: 8.5 10*3/uL (ref 4.0–10.5)

## 2013-05-29 MED ORDER — ALBUTEROL SULFATE HFA 108 (90 BASE) MCG/ACT IN AERS
2.0000 | INHALATION_SPRAY | RESPIRATORY_TRACT | Status: DC | PRN
  Administered 2013-05-29: 2 via RESPIRATORY_TRACT
  Filled 2013-05-29: qty 6.7

## 2013-05-29 NOTE — ED Notes (Signed)
Pt ambulatory to exam room with steady gait and no shortness of breath observed oxygen saturation is 99% on room air pt has numerous complaints and concerns of which are mold and mildew in her home. Pt states " some days my breath comes and goes it will just quit" asked patient what happens when it goes away. Pt states" well it just comes right back"

## 2013-05-29 NOTE — ED Provider Notes (Signed)
History    CSN: [id number] Arrival date & time 05/29/13  1145  First MD Initiated Contact with Patient 05/29/13 1202     Chief Complaint  Patient presents with  . feels like cant get breath...allergy to mold and mildew?    (Consider location/radiation/quality/duration/timing/severity/associated sxs/prior Treatment) HPI Comments: Patient presents with multiple complaints that she feels as related to mold allergies.  She reports dizziness for the past week, shortness of breath, cough, and generalized malaise.  She denies chest pain.  She was seen here for dizziness two months ago and was told she had perforated tm.    Patient is a 47 y.o. female presenting with shortness of breath. The history is provided by the patient.  Shortness of Breath Severity:  Moderate Onset quality:  Gradual Timing:  Constant Progression:  Worsening Chronicity:  New Context: activity   Context: not URI   Relieved by:  Nothing Worsened by:  Nothing tried Ineffective treatments:  None tried Associated symptoms: no abdominal pain, no chest pain and no fever    Past Medical History  Diagnosis Date  . GERD (gastroesophageal reflux disease)    Past Surgical History  Procedure Laterality Date  . Appendectomy    . Cesarean section    . Tubal ligation     History reviewed. No pertinent family history. History  Substance Use Topics  . Smoking status: Current Every Day Smoker -- 0.50 packs/day    Types: Cigarettes  . Smokeless tobacco: Not on file  . Alcohol Use: No   OB History   Grav Para Term Preterm Abortions TAB SAB Ect Mult Living                 Review of Systems  Constitutional: Positive for fatigue. Negative for fever and chills.  Respiratory: Positive for shortness of breath.   Cardiovascular: Negative for chest pain.  Gastrointestinal: Negative for abdominal pain.  All other systems reviewed and are negative.    Allergies  Review of patient's allergies indicates no active  allergies.  Home Medications   Current Outpatient Rx  Name  Route  Sig  Dispense  Refill  . hydrOXYzine (ATARAX/VISTARIL) 25 MG tablet   Oral   Take 25 mg by mouth 3 (three) times daily as needed for itching.         Marland Kitchen acetaminophen (TYLENOL) 325 MG tablet   Oral   Take 650 mg by mouth every 6 (six) hours as needed. Patient used this medication for pain.         Marland Kitchen AMOXICILLIN PO   Oral   Take by mouth.         Marland Kitchen azithromycin (ZITHROMAX) 250 MG tablet      1 every day until finished.   4 tablet   0   . dextromethorphan-guaiFENesin (MUCINEX DM) 30-600 MG per 12 hr tablet   Oral   Take 1 tablet by mouth every 12 (twelve) hours. Patient used this medication for sinus congestion.         . diazepam (VALIUM) 10 MG tablet   Oral   Take 1 tablet (10 mg total) by mouth every 6 (six) hours as needed (vertigo).   25 tablet   0   . Escitalopram Oxalate (LEXAPRO PO)   Oral   Take by mouth.         Marland Kitchen ibuprofen (ADVIL,MOTRIN) 200 MG tablet   Oral   Take 400 mg by mouth every 6 (six) hours as needed. Patient used this medication  for pain.         . MECLIZINE HCL PO   Oral   Take by mouth.         . Naproxen (NAPROSYN PO)   Oral   Take by mouth.         . sodium-potassium bicarbonate (ALKA-SELTZER GOLD) TBEF   Oral   Take 2 tablets by mouth daily as needed. Patient used this medication for cold.         . Triprolidine-Pseudoephedrine (SUDAFED SINUS NIGHTTIME PO)   Oral   Take 2 tablets by mouth daily as needed. Patient used this medication for sinus congestion.          BP 113/68  Pulse 68  Temp(Src) 98.5 F (36.9 C) (Oral)  Resp 18  Ht 5\' 3"  (1.6 m)  Wt 187 lb (84.823 kg)  BMI 33.13 kg/m2  SpO2 100%  LMP 03/18/2013 Physical Exam  Nursing note and vitals reviewed. Constitutional: She is oriented to person, place, and time. She appears well-developed and well-nourished. No distress.  HENT:  Head: Normocephalic and atraumatic.  Mouth/Throat:  Oropharynx is clear and moist.  Eyes: EOM are normal. Pupils are equal, round, and reactive to light.  Neck: Normal range of motion. Neck supple.  Cardiovascular: Normal rate and regular rhythm.  Exam reveals no gallop and no friction rub.   No murmur heard. Pulmonary/Chest: Effort normal and breath sounds normal. No respiratory distress. She has no wheezes.  Abdominal: Soft. Bowel sounds are normal. She exhibits no distension. There is no tenderness.  Musculoskeletal: Normal range of motion. She exhibits no edema.  Neurological: She is alert and oriented to person, place, and time. No cranial nerve deficit. She exhibits normal muscle tone. Coordination normal.  Skin: Skin is warm and dry. She is not diaphoretic.    ED Course  Procedures (including critical care time) Labs Reviewed  CBC WITH DIFFERENTIAL  COMPREHENSIVE METABOLIC PANEL   No results found. No diagnosis found.   Date: 05/29/2013  Rate: 70  Rhythm: normal sinus rhythm  QRS Axis: normal  Intervals: normal  ST/T Wave abnormalities: normal  Conduction Disutrbances:none  Narrative Interpretation:   Old EKG Reviewed: unchanged    MDM  The patient presents with complaints of shortness of breath that she is convinced is related to mold in her house.  I am not sure if this is the cause, but she appears well and there is no hypoxia, pneumonia on the chest xray, or other evidence of an acute process.  Will discharge to home, return prn.   Geoffery Lyons, MD 05/29/13 [phone]

## 2013-08-15 IMAGING — CR DG NECK SOFT TISSUE
1 series · 1 of 1 positions shown · non-contrast
Comparison: None.

CLINICAL DATA: Shortness of breath with difficulty swallowing and
breathing.

NECK SOFT TISSUES - 1+ VIEW

[w soft tissue neck]
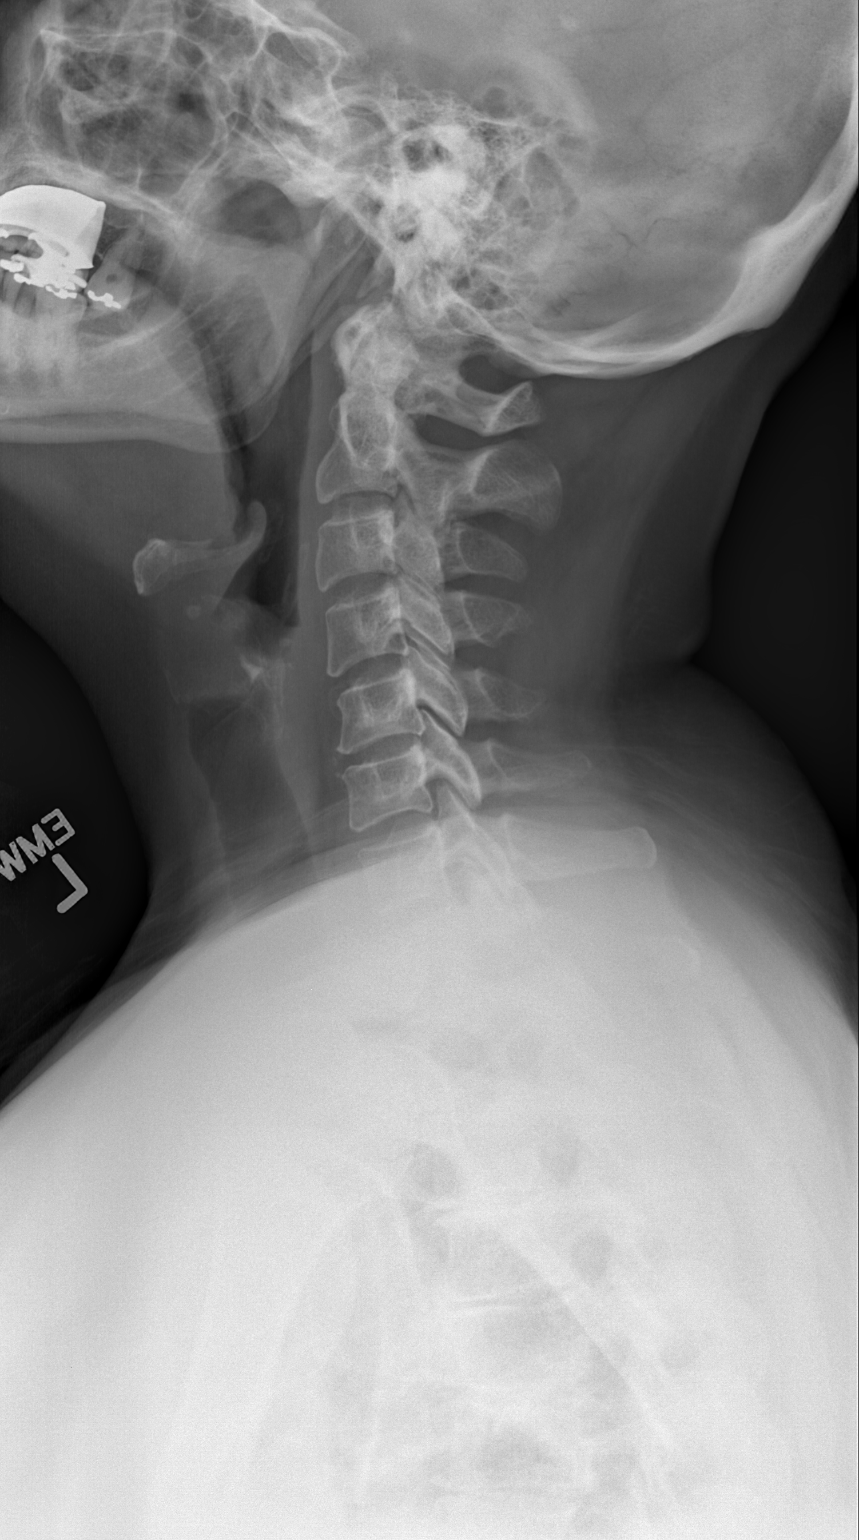

[1 of 1 positions shown; findings below may reference images not displayed]

FINDINGS: The prevertebral soft tissues are normal.  There is no
evidence of foreign body or swelling of the epiglottis.  The
cervical spine demonstrates no significant findings.
IMPRESSION: Negative lateral soft tissues of the neck.

## 2014-08-03 ENCOUNTER — Encounter (HOSPITAL_BASED_OUTPATIENT_CLINIC_OR_DEPARTMENT_OTHER): Payer: Self-pay | Admitting: Emergency Medicine

## 2014-08-03 ENCOUNTER — Emergency Department (HOSPITAL_BASED_OUTPATIENT_CLINIC_OR_DEPARTMENT_OTHER)
Admission: EM | Admit: 2014-08-03 | Discharge: 2014-08-03 | Disposition: A | Discharge status: Home / Self Care | Payer: Self-pay | Attending: Emergency Medicine | Admitting: Emergency Medicine

## 2014-08-03 DIAGNOSIS — R42 Dizziness and giddiness: Secondary | ICD-10-CM | POA: Insufficient documentation

## 2014-08-03 DIAGNOSIS — Z792 Long term (current) use of antibiotics: Secondary | ICD-10-CM | POA: Insufficient documentation

## 2014-08-03 DIAGNOSIS — Z3202 Encounter for pregnancy test, result negative: Secondary | ICD-10-CM | POA: Insufficient documentation

## 2014-08-03 DIAGNOSIS — Z79899 Other long term (current) drug therapy: Secondary | ICD-10-CM | POA: Insufficient documentation

## 2014-08-03 DIAGNOSIS — F17200 Nicotine dependence, unspecified, uncomplicated: Secondary | ICD-10-CM | POA: Insufficient documentation

## 2014-08-03 DIAGNOSIS — Z8719 Personal history of other diseases of the digestive system: Secondary | ICD-10-CM | POA: Insufficient documentation

## 2014-08-03 HISTORY — DX: Abnormal results of liver function studies: R94.5

## 2014-08-03 HISTORY — DX: Other specified abnormal findings of blood chemistry: R79.89

## 2014-08-03 HISTORY — DX: Dizziness and giddiness: R42

## 2014-08-03 LAB — URINALYSIS, ROUTINE W REFLEX MICROSCOPIC
Bilirubin Urine: NEGATIVE
Glucose, UA: NEGATIVE mg/dL
Hgb urine dipstick: NEGATIVE
Ketones, ur: NEGATIVE mg/dL
LEUKOCYTES UA: NEGATIVE
Nitrite: NEGATIVE
PROTEIN: NEGATIVE mg/dL
Specific Gravity, Urine: 1.005 (ref 1.005–1.030)
UROBILINOGEN UA: 0.2 mg/dL (ref 0.0–1.0)
pH: 7 (ref 5.0–8.0)

## 2014-08-03 LAB — CBC
HCT: 40.1 % (ref 36.0–46.0)
Hemoglobin: 13.4 g/dL (ref 12.0–15.0)
MCH: 30 pg (ref 26.0–34.0)
MCHC: 33.4 g/dL (ref 30.0–36.0)
MCV: 89.7 fL (ref 78.0–100.0)
PLATELETS: 203 10*3/uL (ref 150–400)
RBC: 4.47 MIL/uL (ref 3.87–5.11)
RDW: 14.1 % (ref 11.5–15.5)
WBC: 6.8 10*3/uL (ref 4.0–10.5)

## 2014-08-03 LAB — BASIC METABOLIC PANEL
Anion gap: 11 (ref 5–15)
BUN: 16 mg/dL (ref 6–23)
CO2: 26 mEq/L (ref 19–32)
Calcium: 9.6 mg/dL (ref 8.4–10.5)
Chloride: 105 mEq/L (ref 96–112)
Creatinine, Ser: 0.7 mg/dL (ref 0.50–1.10)
Glucose, Bld: 95 mg/dL (ref 70–99)
POTASSIUM: 4.1 meq/L (ref 3.7–5.3)
SODIUM: 142 meq/L (ref 137–147)

## 2014-08-03 LAB — PREGNANCY, URINE: Preg Test, Ur: NEGATIVE

## 2014-08-03 MED ORDER — ONDANSETRON 4 MG PO TBDP: ORAL_TABLET | ORAL | Status: DC

## 2014-08-03 MED ORDER — SODIUM CHLORIDE 0.9 % IV BOLUS (SEPSIS)
1000.0000 mL | INTRAVENOUS | Status: AC
  Administered 2014-08-03: 1000 mL via INTRAVENOUS

## 2014-08-03 MED ORDER — LORAZEPAM 1 MG PO TABS
0.5000 mg | ORAL_TABLET | ORAL | Status: AC
  Administered 2014-08-03: 0.5 mg via ORAL
  Filled 2014-08-03: qty 1

## 2014-08-03 MED ORDER — LORAZEPAM 0.5 MG PO TABS: 0.5000 mg | ORAL_TABLET | Freq: Three times a day (TID) | ORAL | Status: DC | PRN

## 2014-08-03 NOTE — ED Notes (Signed)
D/c home with family- rx x 2 given at d/c for ativan and zofran

## 2014-08-03 NOTE — Discharge Instructions (Signed)

## 2014-08-03 NOTE — ED Notes (Signed)
Patient states she was driving to work and when she pulled into the parking lot, she looked down at her phone and the dizziness work. States she feels like her eyes are spinning.  States she took a 25 mg antivert at [facility] and did not get any results so she took another 25 mg.  States her balance is off, and this episode of vertigo is worse than normal.

## 2014-08-03 NOTE — ED Provider Notes (Signed)
CSN: [id number]     Arrival date & time 08/03/14  1515 History   First MD Initiated Contact with Patient 08/03/14 1546     Chief Complaint  Patient presents with  . Dizziness     (Consider location/radiation/quality/duration/timing/severity/associated sxs/prior Treatment) Patient is a 48 y.o. female presenting with dizziness. The history is provided by the patient.  Dizziness Quality:  Vertigo Severity:  Severe Onset quality:  Sudden Duration:  4 hours Timing:  Constant Progression:  Improving Chronicity:  Recurrent Context: head movement   Relieved by:  Nothing Worsened by:  Being still Ineffective treatments: meclizine 50 mg. Associated symptoms: no chest pain, no diarrhea, no headaches, no nausea, no shortness of breath and no vomiting     Past Medical History  Diagnosis Date  . GERD (gastroesophageal reflux disease)   . Vertigo   . LFT elevation    Past Surgical History  Procedure Laterality Date  . Appendectomy    . Cesarean section    . Tubal ligation     No family history on file. History  Substance Use Topics  . Smoking status: Current Every Day Smoker -- 0.50 packs/day    Types: Cigarettes  . Smokeless tobacco: Not on file  . Alcohol Use: No   OB History   Grav Para Term Preterm Abortions TAB SAB Ect Mult Living                 Review of Systems  Constitutional: Negative for fever and fatigue.  HENT: Negative for congestion and drooling.   Eyes: Negative for pain.  Respiratory: Negative for cough and shortness of breath.   Cardiovascular: Negative for chest pain.  Gastrointestinal: Negative for nausea, vomiting, abdominal pain and diarrhea.  Genitourinary: Negative for dysuria and hematuria.  Musculoskeletal: Negative for back pain, gait problem and neck pain.  Skin: Negative for color change.  Neurological: Positive for dizziness. Negative for headaches.  Hematological: Negative for adenopathy.  Psychiatric/Behavioral: Negative for behavioral  problems.  All other systems reviewed and are negative.     Allergies  Review of patient's allergies indicates no known allergies.  Home Medications   Prior to Admission medications   Medication Sig Start Date End Date Taking? Authorizing Provider  acetaminophen (TYLENOL) 325 MG tablet Take 650 mg by mouth every 6 (six) hours as needed. Patient used this medication for pain.    Historical Provider, MD  AMOXICILLIN PO Take by mouth.    Historical Provider, MD  azithromycin (ZITHROMAX) 250 MG tablet 1 every day until finished. 01/06/12   Elson Areas, PA-C  dextromethorphan-guaiFENesin [facility] DM) 30-600 MG per 12 hr tablet Take 1 tablet by mouth every 12 (twelve) hours. Patient used this medication for sinus congestion.    Historical Provider, MD  diazepam (VALIUM) 10 MG tablet Take 1 tablet (10 mg total) by mouth every 6 (six) hours as needed (vertigo). 02/13/13   Gwyneth Sprout, MD  Escitalopram Oxalate (LEXAPRO PO) Take by mouth.    Historical Provider, MD  hydrOXYzine (ATARAX/VISTARIL) 25 MG tablet Take 25 mg by mouth 3 (three) times daily as needed for itching.    Historical Provider, MD  ibuprofen (ADVIL,MOTRIN) 200 MG tablet Take 400 mg by mouth every 6 (six) hours as needed. Patient used this medication for pain.    Historical Provider, MD  MECLIZINE HCL PO Take by mouth.    Historical Provider, MD  Naproxen (NAPROSYN PO) Take by mouth.    Historical Provider, MD  sodium-potassium bicarbonate (ALKA-SELTZER GOLD) TBEF  Take 2 tablets by mouth daily as needed. Patient used this medication for cold.    Historical Provider, MD  Triprolidine-Pseudoephedrine (SUDAFED SINUS NIGHTTIME PO) Take 2 tablets by mouth daily as needed. Patient used this medication for sinus congestion.    Historical Provider, MD   There were no vitals taken for this visit. Physical Exam  Nursing note and vitals reviewed. Constitutional: She is oriented to person, place, and time. She appears well-developed and  well-nourished.  HENT:  Head: Normocephalic and atraumatic.  Mouth/Throat: Oropharynx is clear and moist. No oropharyngeal exudate.  Tympanic membranes clear bilaterally.  Eyes: Conjunctivae and EOM are normal. Pupils are equal, round, and reactive to light.  Neck: Normal range of motion. Neck supple.  Cardiovascular: Normal rate, regular rhythm, normal heart sounds and intact distal pulses.  Exam reveals no gallop and no friction rub.   No murmur heard. Pulmonary/Chest: Effort normal and breath sounds normal. No respiratory distress. She has no wheezes.  Abdominal: Soft. Bowel sounds are normal. There is no tenderness. There is no rebound and no guarding.  Musculoskeletal: Normal range of motion. She exhibits no edema and no tenderness.  Neurological: She is alert and oriented to person, place, and time.  alert, oriented x3 speech: normal in context and clarity memory: intact grossly cranial nerves II-XII: intact motor strength: full proximally and distally no involuntary movements or tremors sensation: intact to light touch diffusely  cerebellar: finger-to-nose and heel-to-shin intact gait: normal forwards and backwards w/ slight disturbance, able to perform tandem gait w/out difficulty  Skin: Skin is warm and dry.  Psychiatric: She has a normal mood and affect. Her behavior is normal.    ED Course  Procedures (including critical care time) Labs Review Labs Reviewed  CBC  BASIC METABOLIC PANEL  URINALYSIS, ROUTINE W REFLEX MICROSCOPIC  PREGNANCY, URINE    Imaging Review No results found.   EKG Interpretation None      MDM   Final diagnoses:  Dizziness    3:52 PM 48 y.o. female w hx of vertigo for years who pw dizziness. Vertigo more frequent in last 6 months. Feels slightly different than previous episodes. Started suddenly at 12:15 while looking down in her car. Sx improving since then.   6:10 PM: I interpreted/reviewed the labs and/or imaging which were  non-contributory.  Pt feeling mildly better. Some dizziness still persists. Doubt CVA given long hx of recurrent vertigo. Will send home w/ ativan and zofran. Pt already has meclizine.  I have discussed the diagnosis/risks/treatment options with the patient and believe the pt to be eligible for discharge home to follow-up with and estab w a pcp. We also discussed returning to the ED immediately if new or worsening sx occur. We discussed the sx which are most concerning (e.g., worsening dizziness, weakness, numbness) that necessitate immediate return. Medications administered to the patient during their visit and any new prescriptions provided to the patient are listed below.  Medications given during this visit Medications  sodium chloride 0.9 % bolus 1,000 mL (1,000 mLs Intravenous New Bag/Given 08/03/14 1630)  LORazepam (ATIVAN) tablet 0.5 mg (0.5 mg Oral Given 08/03/14 1622)    New Prescriptions   LORAZEPAM (ATIVAN) 0.5 MG TABLET    Take 1 tablet (0.5 mg total) by mouth every 8 (eight) hours as needed (dizziness).   ONDANSETRON (ZOFRAN ODT) 4 MG DISINTEGRATING TABLET     ODT q4 hours prn nausea/vomit      Purvis Sheffield, MD 08/03/14 1811

## 2015-07-10 ENCOUNTER — Emergency Department (HOSPITAL_BASED_OUTPATIENT_CLINIC_OR_DEPARTMENT_OTHER)
Admission: EM | Admit: 2015-07-10 | Discharge: 2015-07-10 | Disposition: A | Discharge status: Home / Self Care | Payer: BLUE CROSS/BLUE SHIELD | Attending: Emergency Medicine | Admitting: Emergency Medicine

## 2015-07-10 ENCOUNTER — Encounter (HOSPITAL_BASED_OUTPATIENT_CLINIC_OR_DEPARTMENT_OTHER): Payer: Self-pay | Admitting: *Deleted

## 2015-07-10 DIAGNOSIS — R21 Rash and other nonspecific skin eruption: Secondary | ICD-10-CM | POA: Diagnosis present

## 2015-07-10 DIAGNOSIS — Z720 Tobacco use: Secondary | ICD-10-CM | POA: Diagnosis not present

## 2015-07-10 DIAGNOSIS — Z8719 Personal history of other diseases of the digestive system: Secondary | ICD-10-CM | POA: Diagnosis not present

## 2015-07-10 DIAGNOSIS — B029 Zoster without complications: Secondary | ICD-10-CM | POA: Insufficient documentation

## 2015-07-10 MED ORDER — OXYCODONE-ACETAMINOPHEN 5-325 MG PO TABS: 1.0000 | ORAL_TABLET | ORAL | Status: DC | PRN

## 2015-07-10 MED ORDER — VALACYCLOVIR HCL 1 G PO TABS: 1000.0000 mg | ORAL_TABLET | Freq: Three times a day (TID) | ORAL | Status: AC

## 2015-07-10 NOTE — ED Notes (Signed)
Pt presents with rash x 1 week at ant chest to left arm. Rash areas are varied in sizes and shape, pt states that rash on left wrist is painful "feels like broken glass"

## 2015-07-10 NOTE — ED Notes (Signed)
Patient states she has a rash that started on her anterior chest two weeks ago.  Thought she had been bitten by a mosquito.  States the rash has spread in clusters over chest and left arm.  Describes as painful to touch.

## 2015-07-10 NOTE — Discharge Instructions (Signed)

## 2015-07-10 NOTE — ED Notes (Signed)
MD at bedside. 

## 2015-07-10 NOTE — ED Provider Notes (Signed)
.CSN: [id number]     Arrival date & time 07/10/15  1058 History   First MD Initiated Contact with Patient 07/10/15 1113     Chief Complaint  Patient presents with  . Rash     (Consider location/radiation/quality/duration/timing/severity/associated sxs/prior Treatment) Patient is a 49 y.o. female presenting with rash.  Rash Location: L chest wall, L wrist and finger. Quality: blistering and painful   Severity:  Moderate Onset quality:  Gradual Duration:  1 week Timing:  Constant Progression:  Worsening Chronicity:  New Context comment:  No sick contacts or exposure Relieved by:  None tried Worsened by:  Nothing tried Ineffective treatments:  None tried Associated symptoms: no abdominal pain, no nausea and not vomiting     Past Medical History  Diagnosis Date  . GERD (gastroesophageal reflux disease)   . Vertigo   . LFT elevation    Past Surgical History  Procedure Laterality Date  . Appendectomy    . Cesarean section    . Tubal ligation     No family history on file. Social History  Substance Use Topics  . Smoking status: Current Every Day Smoker -- 0.50 packs/day    Types: Cigarettes  . Smokeless tobacco: Never Used  . Alcohol Use: No   OB History    No data available     Review of Systems  Gastrointestinal: Negative for nausea, vomiting and abdominal pain.  Skin: Positive for rash.  All other systems reviewed and are negative.     Allergies  Review of patient's allergies indicates no known allergies.  Home Medications   Prior to Admission medications   Medication Sig Start Date End Date Taking? Authorizing Provider  acetaminophen (TYLENOL) 325 MG tablet Take 650 mg by mouth every 6 (six) hours as needed. Patient used this medication for pain.   Yes Historical Provider, MD  Escitalopram Oxalate (LEXAPRO PO) Take by mouth.   Yes Historical Provider, MD  LORazepam (ATIVAN) 0.5 MG tablet Take 1 tablet (0.5 mg total) by mouth every 8 (eight) hours as  needed (dizziness). 08/03/14  Yes Purvis Sheffield, MD  MECLIZINE HCL PO Take by mouth.   Yes Historical Provider, MD  AMOXICILLIN PO Take by mouth.    Historical Provider, MD  azithromycin (ZITHROMAX) 250 MG tablet 1 every day until finished. 01/06/12   Elson Areas, PA-C  dextromethorphan-guaiFENesin [facility] DM) 30-600 MG per 12 hr tablet Take 1 tablet by mouth every 12 (twelve) hours. Patient used this medication for sinus congestion.    Historical Provider, MD  diazepam (VALIUM) 10 MG tablet Take 1 tablet (10 mg total) by mouth every 6 (six) hours as needed (vertigo). 02/13/13   Gwyneth Sprout, MD  hydrOXYzine (ATARAX/VISTARIL) 25 MG tablet Take 25 mg by mouth 3 (three) times daily as needed for itching.    Historical Provider, MD  ibuprofen (ADVIL,MOTRIN) 200 MG tablet Take 400 mg by mouth every 6 (six) hours as needed. Patient used this medication for pain.    Historical Provider, MD  Naproxen (NAPROSYN PO) Take by mouth.    Historical Provider, MD  ondansetron (ZOFRAN ODT) 4 MG disintegrating tablet 4mg  ODT q4 hours prn nausea/vomit 08/03/14   Purvis Sheffield, MD  oxyCODONE-acetaminophen (PERCOCET/ROXICET) 5-325 MG per tablet Take 1-2 tablets by mouth every 4 (four) hours as needed. 07/10/15   Mirian Mo, MD  sodium-potassium bicarbonate (ALKA-SELTZER GOLD) TBEF Take 2 tablets by mouth daily as needed. Patient used this medication for cold.    Historical Provider, MD  Triprolidine-Pseudoephedrine (  SUDAFED SINUS NIGHTTIME PO) Take 2 tablets by mouth daily as needed. Patient used this medication for sinus congestion.    Historical Provider, MD  valACYclovir (VALTREX) 1000 MG tablet Take 1 tablet (1,000 mg total) by mouth 3 (three) times daily. 07/10/15 07/24/15  Mirian Mo, MD   BP 131/77 mmHg  Pulse 74  Temp(Src) 98.1 F (36.7 C) (Oral)  Resp 20  Ht  (1.626 m)  Wt 168 lb (76.204 kg)  BMI 28.82 kg/m2  SpO2 100% Physical Exam  Constitutional: She is oriented to person, place,  and time. She appears well-developed and well-nourished.  HENT:  Head: Normocephalic and atraumatic.  Right Ear: External ear normal.  Left Ear: External ear normal.  Eyes: Conjunctivae and EOM are normal. Pupils are equal, round, and reactive to light.  Neck: Normal range of motion. Neck supple.  Cardiovascular: Normal rate, regular rhythm, normal heart sounds and intact distal pulses.   Pulmonary/Chest: Effort normal and breath sounds normal.  Abdominal: Soft. Bowel sounds are normal. There is no tenderness.  Musculoskeletal: Normal range of motion.  Neurological: She is alert and oriented to person, place, and time.  Skin: Skin is warm and dry.  Clustered vesicular rash in dermatomal distribution with erythematous base  Vitals reviewed.   ED Course  Procedures (including critical care time) Labs Review Labs Reviewed - No data to display  Imaging Review No results found. I have personally reviewed and evaluated these images and lab results as part of my medical decision-making.   EKG Interpretation None      MDM   Final diagnoses:  Shingles    49 y.o. female without pertinent PMH presents with No signs of Ramsay Hunt syndrome. No ocular involvement, patient well-appearing. We'll discharge home with Valtrex and small amount of pain medicine to follow-up with PCP.    I have reviewed all laboratory and imaging studies if ordered as above  1. Shingles         Mirian Mo, MD 07/11/15 [phone]

## 2015-12-06 ENCOUNTER — Emergency Department (HOSPITAL_BASED_OUTPATIENT_CLINIC_OR_DEPARTMENT_OTHER): Payer: BLUE CROSS/BLUE SHIELD

## 2015-12-06 ENCOUNTER — Encounter (HOSPITAL_BASED_OUTPATIENT_CLINIC_OR_DEPARTMENT_OTHER): Payer: Self-pay

## 2015-12-06 ENCOUNTER — Emergency Department (HOSPITAL_BASED_OUTPATIENT_CLINIC_OR_DEPARTMENT_OTHER)
Admission: EM | Admit: 2015-12-06 | Discharge: 2015-12-06 | Disposition: A | Discharge status: Home / Self Care | Payer: BLUE CROSS/BLUE SHIELD | Attending: Emergency Medicine | Admitting: Emergency Medicine

## 2015-12-06 DIAGNOSIS — K5901 Slow transit constipation: Secondary | ICD-10-CM | POA: Insufficient documentation

## 2015-12-06 DIAGNOSIS — F17210 Nicotine dependence, cigarettes, uncomplicated: Secondary | ICD-10-CM | POA: Insufficient documentation

## 2015-12-06 DIAGNOSIS — Z3202 Encounter for pregnancy test, result negative: Secondary | ICD-10-CM | POA: Insufficient documentation

## 2015-12-06 DIAGNOSIS — J069 Acute upper respiratory infection, unspecified: Secondary | ICD-10-CM

## 2015-12-06 LAB — COMPREHENSIVE METABOLIC PANEL
ALBUMIN: 4.3 g/dL (ref 3.5–5.0)
ALK PHOS: 102 U/L (ref 38–126)
ALT: 81 U/L — ABNORMAL HIGH (ref 14–54)
AST: 61 U/L — AB (ref 15–41)
Anion gap: 8 (ref 5–15)
BUN: 9 mg/dL (ref 6–20)
CO2: 29 mmol/L (ref 22–32)
CREATININE: 0.6 mg/dL (ref 0.44–1.00)
Calcium: 9.4 mg/dL (ref 8.9–10.3)
Chloride: 105 mmol/L (ref 101–111)
GFR calc Af Amer: >60 mL/min (ref >60)
GFR calc non Af Amer: >60 mL/min (ref >60)
GLUCOSE: 93 mg/dL (ref 65–99)
Potassium: 3.8 mmol/L (ref 3.5–5.1)
SODIUM: 142 mmol/L (ref 135–145)
TOTAL PROTEIN: 7 g/dL (ref 6.5–8.1)
Total Bilirubin: 0.3 mg/dL (ref 0.3–1.2)

## 2015-12-06 LAB — CBC WITH DIFFERENTIAL/PLATELET
BASOS ABS: 0 10*3/uL (ref 0.0–0.1)
Basophils Relative: 0 %
EOS ABS: 0.2 10*3/uL (ref 0.0–0.7)
EOS PCT: 2 %
HCT: 43 % (ref 36.0–46.0)
Hemoglobin: 14.3 g/dL (ref 12.0–15.0)
LYMPHS PCT: 41 %
Lymphs Abs: 3.6 10*3/uL (ref 0.7–4.0)
MCH: 29.7 pg (ref 26.0–34.0)
MCHC: 33.3 g/dL (ref 30.0–36.0)
MCV: 89.4 fL (ref 78.0–100.0)
MONO ABS: 0.7 10*3/uL (ref 0.1–1.0)
Monocytes Relative: 8 %
Neutro Abs: 4.2 10*3/uL (ref 1.7–7.7)
Neutrophils Relative %: 49 %
PLATELETS: 212 10*3/uL (ref 150–400)
RBC: 4.81 MIL/uL (ref 3.87–5.11)
RDW: 14 % (ref 11.5–15.5)
WBC: 8.6 10*3/uL (ref 4.0–10.5)

## 2015-12-06 LAB — PREGNANCY, URINE: Preg Test, Ur: NEGATIVE

## 2015-12-06 LAB — URINALYSIS, ROUTINE W REFLEX MICROSCOPIC
Bilirubin Urine: NEGATIVE
GLUCOSE, UA: NEGATIVE mg/dL
Hgb urine dipstick: NEGATIVE
Ketones, ur: NEGATIVE mg/dL
LEUKOCYTES UA: NEGATIVE
Nitrite: NEGATIVE
PH: 7 (ref 5.0–8.0)
Protein, ur: NEGATIVE mg/dL
Specific Gravity, Urine: 1.008 (ref 1.005–1.030)

## 2015-12-06 LAB — LIPASE, BLOOD: Lipase: 27 U/L (ref 11–51)

## 2015-12-06 MED ORDER — AZITHROMYCIN 250 MG PO TABS: 250.0000 mg | ORAL_TABLET | Freq: Every day | ORAL | Status: AC

## 2015-12-06 MED FILL — AZITHROMYCIN 250 MG TABLET: 250 | 5 days supply | Qty: 6 | Fill #0

## 2015-12-06 NOTE — ED Notes (Signed)
MD at bedside. 

## 2015-12-06 NOTE — ED Notes (Signed)
Patient transported to X-ray 

## 2015-12-06 NOTE — ED Notes (Addendum)
C/o head congestion and wheezing with prod cough, epigastric pain since 10/17/15-diarrhea x 3 days-formed dark stool this am

## 2015-12-06 NOTE — ED Provider Notes (Signed)
CSN: [id number]     Arrival date & time 12/06/15  1346 History   First MD Initiated Contact with Patient 12/06/15 1557     Chief Complaint  Patient presents with  . Abdominal Pain     (Consider location/radiation/quality/duration/timing/severity/associated sxs/prior Treatment) HPI Comments: The patient is a 50 year old female, she does have a history of acid reflux disease, she presents after stating that she has had approximately 3 years of abdominal distention, as long as she can remember but reports over the last 3 days she has had increased abdominal distention in the epigastrium with some tenderness, she had diarrhea 2 days ago but today she has not been able have a bowel movement, she feels like her bowels or gurgling. She also reports having a persistent cough productive of phlegm and significant nasal congestion. These symptoms have worsened over the last 3 days, no fevers, she has been using Zyrtec without relief  Patient is a 50 y.o. female presenting with abdominal pain. The history is provided by the patient.  Abdominal Pain   Past Medical History  Diagnosis Date  . GERD (gastroesophageal reflux disease)   . Vertigo   . LFT elevation    Past Surgical History  Procedure Laterality Date  . Appendectomy    . Cesarean section    . Tubal ligation     No family history on file. Social History  Substance Use Topics  . Smoking status: Current Every Day Smoker -- 0.50 packs/day    Types: Cigarettes  . Smokeless tobacco: Never Used  . Alcohol Use: No   OB History    No data available     Review of Systems  Gastrointestinal: Positive for abdominal pain.  All other systems reviewed and are negative.     Allergies  Review of patient's allergies indicates no known allergies.  Home Medications   Prior to Admission medications   Medication Sig Start Date End Date Taking? Authorizing Provider  azithromycin (ZITHROMAX Z-PAK) 250 MG tablet Take 1 tablet (250 mg total)  by mouth daily.  PO day 1, then  PO days 205 12/06/15   Eber Hong, MD   BP 147/95 mmHg  Pulse 74  Temp(Src) 98.2 F (36.8 C) (Oral)  Resp 18  Ht  (1.6 m)  Wt 175 lb (79.379 kg)  BMI 31.01 kg/m2  SpO2 99% Physical Exam  Constitutional: She appears well-developed and well-nourished. No distress.  HENT:  Head: Normocephalic and atraumatic.  Mouth/Throat: Oropharynx is clear and moist. No oropharyngeal exudate.  Eyes: Conjunctivae and EOM are normal. Pupils are equal, round, and reactive to light. Right eye exhibits no discharge. Left eye exhibits no discharge. No scleral icterus.  Neck: Normal range of motion. Neck supple. No JVD present. No thyromegaly present.  Cardiovascular: Normal rate, regular rhythm, normal heart sounds and intact distal pulses.  Exam reveals no gallop and no friction rub.   No murmur heard. Pulmonary/Chest: Effort normal and breath sounds normal. No respiratory distress. She has no wheezes. She has no rales.  Abdominal: Soft. Bowel sounds are normal. She exhibits no distension and no mass. There is tenderness ( Abdomen is soft, nondistended, minimal tenderness in the epigastrium, no masses).  Musculoskeletal: Normal range of motion. She exhibits no edema or tenderness.  Lymphadenopathy:    She has no cervical adenopathy.  Neurological: She is alert. Coordination normal.  Skin: Skin is warm and dry. No rash noted. No erythema.  Psychiatric: She has a normal mood and affect. Her behavior is  normal.  Nursing note and vitals reviewed.   ED Course  Procedures (including critical care time) Labs Review Labs Reviewed  COMPREHENSIVE METABOLIC PANEL - Abnormal; Notable for the following:    AST 61 (*)    ALT 81 (*)    All other components within normal limits  URINALYSIS, ROUTINE W REFLEX MICROSCOPIC (NOT AT [facility])  PREGNANCY, URINE  LIPASE, BLOOD  CBC WITH DIFFERENTIAL/PLATELET    Imaging Review Dg Abd Acute W/chest  12/06/2015  CLINICAL  DATA:  Upper abdominal pain and distension, shortness of breath and discomfort since December, 2016. Initial encounter. EXAM: DG ABDOMEN ACUTE W/ 1V CHEST COMPARISON:  PA and lateral chest 01/08/2015. FINDINGS: Single view of the chest demonstrates clear lungs and normal heart size. No pneumothorax or pleural effusion. Two views of the abdomen show a nonobstructive bowel gas pattern. There is no free intraperitoneal air. Large colonic stool burden is noted. Tubal ligation clips are seen. No focal bony abnormality. IMPRESSION: No acute finding chest or abdomen. Large colonic stool burden. Electronically Signed   By: Drusilla Kanner M.D.   On: 12/06/2015 16:41   I have personally reviewed and evaluated these images and lab results as part of my medical decision-making.    MDM   Final diagnoses:  Slow transit constipation  Upper respiratory infection    Overall the patient is well-appearing with no signs of pneumonia, vital signs are beautiful except for mild hypertension, she has labs which are unremarkable as well as far. Her chest x-ray and acute abdominal series show no signs of bowel obstruction or perforation but do show a heavy stool burden consistent with a constipated state which would also explain the patient's symptoms. She has been informed that she needs to take MiraLAX twice daily, she will be given an antibiotic for what may be a persistent bronchitis in a patient who is a smoker. I have also counseled her on smoking, the patient is stable for discharge, she'll need to follow-up closely should her symptoms continue as she may need CT scan imaging to further evaluate abdominal discomfort  In addition to written d/c instructions, the pt was given verbal d/c instructions including the indications for return and expressed understanding to the instructions.  Meds given in ED:  Medications - No data to display  Discharge Medication List as of 12/06/2015  5:06 PM    START taking these  medications   Details  azithromycin (ZITHROMAX Z-PAK) 250 MG tablet Take 1 tablet (250 mg total) by mouth daily.  PO day 1, then  PO days 205, Starting 12/06/2015, Until Discontinued, Print           Eber Hong, MD 12/06/15 2348

## 2015-12-06 NOTE — Discharge Instructions (Signed)
Miralax twice daily until having daily soft stools Z pak for 5 days  Please obtain all of your results from medical records or have your doctors office obtain the results - share them with your doctor - you should be seen at your doctors office in the next 2 days. Call today to arrange your follow up. Take the medications as prescribed. Please review all of the medicines and only take them if you do not have an allergy to them. Please be aware that if you are taking birth control pills, taking other prescriptions, ESPECIALLY ANTIBIOTICS may make the birth control ineffective - if this is the case, either do not engage in sexual activity or use alternative methods of birth control such as condoms until you have finished the medicine and your family doctor says it is OK to restart them. If you are on a blood thinner such as COUMADIN, be aware that any other medicine that you take may cause the coumadin to either work too much, or not enough - you should have your coumadin level rechecked in next 7 days if this is the case.  ?  It is also a possibility that you have an allergic reaction to any of the medicines that you have been prescribed - Everybody reacts differently to medications and while MOST people have no trouble with most medicines, you may have a reaction such as nausea, vomiting, rash, swelling, shortness of breath. If this is the case, please stop taking the medicine immediately and contact your physician.  ?  You should return to the ER if you develop severe or worsening symptoms.    Constipation, Adult Constipation is when a person has fewer than three bowel movements a week, has difficulty having a bowel movement, or has stools that are dry, hard, or larger than normal. As people grow older, constipation is more common. A low-fiber diet, not taking in enough fluids, and taking certain medicines may make constipation worse.  CAUSES   Certain medicines, such as antidepressants, pain  medicine, iron supplements, antacids, and water pills.   Certain diseases, such as diabetes, irritable bowel syndrome (IBS), thyroid disease, or depression.   Not drinking enough water.   Not eating enough fiber-rich foods.   Stress or travel.   Lack of physical activity or exercise.   Ignoring the urge to have a bowel movement.   Using laxatives too much.  SIGNS AND SYMPTOMS   Having fewer than three bowel movements a week.   Straining to have a bowel movement.   Having stools that are hard, dry, or larger than normal.   Feeling full or bloated.   Pain in the lower abdomen.   Not feeling relief after having a bowel movement.  DIAGNOSIS  Your health care provider will take a medical history and perform a physical exam. Further testing may be done for severe constipation. Some tests may include:  A barium enema X-ray to examine your rectum, colon, and, sometimes, your small intestine.   A sigmoidoscopy to examine your lower colon.   A colonoscopy to examine your entire colon. TREATMENT  Treatment will depend on the severity of your constipation and what is causing it. Some dietary treatments include drinking more fluids and eating more fiber-rich foods. Lifestyle treatments may include regular exercise. If these diet and lifestyle recommendations do not help, your health care provider may recommend taking over-the-counter laxative medicines to help you have bowel movements. Prescription medicines may be prescribed if over-the-counter medicines do not work.  HOME CARE INSTRUCTIONS   Eat foods that have a lot of fiber, such as fruits, vegetables, whole grains, and beans.  Limit foods high in fat and processed sugars, such as french fries, hamburgers, cookies, candies, and soda.   A fiber supplement may be added to your diet if you cannot get enough fiber from foods.   Drink enough fluids to keep your urine clear or pale yellow.   Exercise regularly or  as directed by your health care provider.   Go to the restroom when you have the urge to go. Do not hold it.   Only take over-the-counter or prescription medicines as directed by your health care provider. Do not take other medicines for constipation without talking to your health care provider first.  SEEK IMMEDIATE MEDICAL CARE IF:   You have bright red blood in your stool.   Your constipation lasts for more than 4 days or gets worse.   You have abdominal or rectal pain.   You have thin, pencil-like stools.   You have unexplained weight loss. MAKE SURE YOU:   Understand these instructions.  Will watch your condition.  Will get help right away if you are not doing well or get worse.   This information is not intended to replace advice given to you by your health care provider. Make sure you discuss any questions you have with your health care provider.   Document Released: 07/31/2004 Document Revised: 11/23/2014 Document Reviewed: 08/14/2013 Elsevier Interactive Patient Education [employer]

## 2020-12-13 ENCOUNTER — Emergency Department (HOSPITAL_BASED_OUTPATIENT_CLINIC_OR_DEPARTMENT_OTHER): Payer: Self-pay

## 2020-12-13 ENCOUNTER — Emergency Department (HOSPITAL_BASED_OUTPATIENT_CLINIC_OR_DEPARTMENT_OTHER)
Admission: EM | Admit: 2020-12-13 | Discharge: 2020-12-13 | Disposition: A | Discharge status: Home / Self Care | Payer: Self-pay | Attending: Emergency Medicine | Admitting: Emergency Medicine

## 2020-12-13 ENCOUNTER — Other Ambulatory Visit (HOSPITAL_BASED_OUTPATIENT_CLINIC_OR_DEPARTMENT_OTHER): Payer: Self-pay | Admitting: Physician Assistant

## 2020-12-13 ENCOUNTER — Encounter (HOSPITAL_BASED_OUTPATIENT_CLINIC_OR_DEPARTMENT_OTHER): Payer: Self-pay

## 2020-12-13 ENCOUNTER — Other Ambulatory Visit: Payer: Self-pay

## 2020-12-13 DIAGNOSIS — F17210 Nicotine dependence, cigarettes, uncomplicated: Secondary | ICD-10-CM | POA: Insufficient documentation

## 2020-12-13 DIAGNOSIS — M79674 Pain in right toe(s): Secondary | ICD-10-CM | POA: Insufficient documentation

## 2020-12-13 MED ORDER — CICLOPIROX 8 % EX SOLN: Freq: Every day | CUTANEOUS | 0 refills | Status: DC

## 2020-12-13 MED ORDER — DOXYCYCLINE HYCLATE 100 MG PO CAPS: 100.0000 mg | ORAL_CAPSULE | Freq: Two times a day (BID) | ORAL | 0 refills | Status: DC

## 2020-12-13 MED FILL — CICLOPIROX 8% SOLUTION: 8 | 30 days supply | Qty: 7 | Fill #0

## 2020-12-13 MED FILL — DOXYCYCLINE HYCLATE 100 MG: 100 | 7 days supply | Qty: 14 | Fill #0

## 2020-12-13 NOTE — ED Notes (Signed)
ED Provider at bedside. 

## 2020-12-13 NOTE — Discharge Instructions (Signed)
It appears that you have a fungal infection of your toe called onychomycosis.  For this you are given a prescription for a topical antifungal however you may need an oral antifungal if this does not improve symptoms.  You may also have a concurrent bacterial infection therefore you were given a prescription for doxycycline. Please take as directed.   Please follow up with your primary care provider within 5-7 days for re-evaluation of your symptoms. If you do not have a primary care provider, information for a healthcare clinic has been provided for you to make arrangements for follow up care. Please return to the emergency department for any new or worsening symptoms.

## 2020-12-13 NOTE — ED Triage Notes (Signed)
Pt arrives ambulatory with pain to right great toe, states that her daughter removed some of her toenail about 2 weeks ago. The toe is swollen and red with a large portion of the nail missing. Pt is not diabetic.

## 2020-12-13 NOTE — ED Provider Notes (Signed)
[facility] EMERGENCY DEPARTMENT Provider Note   CSN: [id number] Arrival date & time: 12/13/20  1056     History Chief Complaint  Patient presents with  . Toe Pain    Wendy Moon is a 55 y.o. female.  HPI   55 year old female with a history of GERD, elevated LFTs, vertigo, who presents to the emergency department today for evaluation of pain to the right great toe.  States that for the last several weeks she feels like the toenail is being lifted off of her right great toe.  States her daughter tried to cut the toenail shorter few weeks ago when she had some brown discharge come out at that time.  Since then she has felt some warmth to the toe, pain and feel like it is red.  She said no documented fevers at home.  She does not have a history of diabetes.  Past Medical History:  Diagnosis Date  . GERD (gastroesophageal reflux disease)   . LFT elevation   . Vertigo     There are no problems to display for this patient.   Past Surgical History:  Procedure Laterality Date  . APPENDECTOMY    . CESAREAN SECTION    . TUBAL LIGATION       OB History   No obstetric history on file.     No family history on file.  Social History   Tobacco Use  . Smoking status: Current Every Day Smoker    Packs/day: 0.50    Types: Cigarettes  . Smokeless tobacco: Never Used  Substance Use Topics  . Alcohol use: No  . Drug use: No    Home Medications Prior to Admission medications   Medication Sig Start Date End Date Taking? Authorizing Provider  ciclopirox (PENLAC) 8 % solution Apply topically at bedtime. Apply over nail and surrounding skin. Apply daily over previous coat. After seven (7) days, may remove with alcohol and continue cycle. 12/13/20  Yes Brick Ketcher S, PA-C  doxycycline (VIBRAMYCIN) 100 MG capsule Take 1 capsule (100 mg total) by mouth 2 (two) times daily for 7 days. 12/13/20 12/20/20 Yes Shafter Jupin S, PA-C  azithromycin (ZITHROMAX Z-PAK) 250 MG  tablet Take 1 tablet (250 mg total) by mouth daily. 500mg  PO day 1, then 250mg  PO days 205 12/06/15   , MD    Allergies    Codeine, Hydroxyzine hcl, Lamotrigine, Methylprednisolone, Amoxicillin-pot clavulanate, and Amphetamine-dextroamphetamine  Review of Systems   Review of Systems  Constitutional: Negative for fever.  Musculoskeletal:       Right great toe pain  Skin: Positive for wound.    Physical Exam Updated Vital Signs BP 117/73 (BP Location: Right Arm)   Pulse (!) 56   Temp 98.1 F (36.7 C) (Oral)   Resp 18   Ht 5\' 3"  (1.6 m)   Wt 90.7 kg   SpO2 100%   BMI 35.43 kg/m   Physical Exam Vitals and nursing note reviewed.  Constitutional:      General: She is not in acute distress.    Appearance: She is well-developed and well-nourished.  HENT:     Head: Normocephalic and atraumatic.  Eyes:     Conjunctiva/sclera: Conjunctivae normal.  Cardiovascular:     Rate and Rhythm: Normal rate.  Pulmonary:     Effort: Pulmonary effort is normal.  Musculoskeletal:        General: Normal range of motion.     Cervical back: Neck supple.  Comments: Right great toe is TTP over the toenail. There is no significant swelling, erythema or warmth. DP pulse is intact  Skin:    General: Skin is warm and dry.  Neurological:     Mental Status: She is alert.  Psychiatric:        Mood and Affect: Mood and affect normal.       ED Results / Procedures / Treatments   Labs (all labs ordered are listed, but only abnormal results are displayed) Labs Reviewed - No data to display  EKG None  Radiology DG Toe Great Right  Result Date: 12/13/2020 CLINICAL DATA:  Right great toe pain with swelling and erythema. A portion of the toenail was removed 2 weeks ago. EXAM: RIGHT GREAT TOE COMPARISON:  Right foot radiographs 05/04/2019 FINDINGS: The bones appear adequately mineralized. There is no evidence of acute fracture, dislocation or bone destruction. The joint spaces are  preserved. There is probable mild soft tissue swelling in the great toe, but no foreign body or soft tissue emphysema. IMPRESSION: No acute osseous findings or radiographic evidence of osteomyelitis. Electronically Signed   By: Carey Bullocks M.D.   On: 12/13/2020 12:50    Procedures Procedures   Medications Ordered in ED Medications - No data to display  ED Course  I have reviewed the triage vital signs and the nursing notes.  Pertinent labs & imaging results that were available during my care of the patient were reviewed by me and considered in my medical decision making (see chart for details).    MDM Rules/Calculators/A&P                          55 year old female presenting for evaluation of toe pain.  On exam it appears that she has onychomycosis however she also reports that she also recently had some pus drainage and she feels like the area is red and warm.  Due to this will cover for potential bacterial infection as well with doxycycline.  We will also give her topical medication for the fungal infection.  Did explain that she will likely need p.o. antifungal medication to resolve this however will defer this to PCP given her history of elevated LFTs and need for follow-up and lab monitoring on this medication.  Have advised on return precautions.  She voices understanding of the plan reasons to return.  All Questions answered.  Patient stable for discharge.  Final Clinical Impression(s) / ED Diagnoses Final diagnoses:  Pain of toe of right foot    Rx / DC Orders ED Discharge Orders         Ordered    doxycycline (VIBRAMYCIN) 100 MG capsule  2 times daily        12/13/20 1443    ciclopirox (PENLAC) 8 % solution  Daily at bedtime        12/13/20 [address], Tuwanna Krausz S, PA-C 12/13/20 1445    Alvira Monday, MD 12/15/20 1115

## 2021-03-19 ENCOUNTER — Other Ambulatory Visit (HOSPITAL_COMMUNITY): Payer: Self-pay

## 2021-10-08 ENCOUNTER — Other Ambulatory Visit: Payer: Self-pay

## 2021-10-08 ENCOUNTER — Emergency Department (HOSPITAL_BASED_OUTPATIENT_CLINIC_OR_DEPARTMENT_OTHER)
Admission: EM | Admit: 2021-10-08 | Discharge: 2021-10-08 | Disposition: A | Discharge status: Home / Self Care | Payer: 59 | Attending: Emergency Medicine | Admitting: Emergency Medicine

## 2021-10-08 ENCOUNTER — Encounter (HOSPITAL_BASED_OUTPATIENT_CLINIC_OR_DEPARTMENT_OTHER): Payer: Self-pay

## 2021-10-08 DIAGNOSIS — F17210 Nicotine dependence, cigarettes, uncomplicated: Secondary | ICD-10-CM | POA: Diagnosis not present

## 2021-10-08 DIAGNOSIS — T7840XA Allergy, unspecified, initial encounter: Secondary | ICD-10-CM

## 2021-10-08 DIAGNOSIS — R059 Cough, unspecified: Secondary | ICD-10-CM | POA: Diagnosis present

## 2021-10-08 DIAGNOSIS — R051 Acute cough: Secondary | ICD-10-CM

## 2021-10-08 NOTE — ED Triage Notes (Signed)
Pt c/o "sinus problems" x 2 week-cough started last night-NAD-steady gait

## 2021-10-08 NOTE — ED Provider Notes (Signed)
[facility] EMERGENCY DEPARTMENT Provider Note   CSN: [id number] Arrival date & time: 10/08/21  1205     History Chief Complaint  Patient presents with   Cough   URI    PAMELA INTRIERI is a 55 y.o. female.  55 y.o female with a PMH of LFT, vertigo, presents to the ED with a chief complaint of "sinus problems ", for the past [redacted] weeks along with a cough that began last night.  Patient reports suffering from sinus issues for several years, this 1 began 1 day ago.  Shes been taking Sudafed without any improvement in symptoms.  The history is provided by the patient.  Cough Cough characteristics:  Dry Sputum characteristics:  Nondescript Onset quality:  Gradual Duration:  1 day Timing:  Constant Progression:  Unchanged Smoker: no   Context: exposure to allergens and weather changes   Relieved by:  Nothing Worsened by:  Nothing Ineffective treatments:  Decongestant Associated symptoms: rhinorrhea and sinus congestion   Associated symptoms: no chest pain, no chills, no diaphoresis, no fever, no headaches, no myalgias, no shortness of breath and no sore throat   URI Presenting symptoms: cough and rhinorrhea   Presenting symptoms: no fever and no sore throat   Associated symptoms: no headaches and no myalgias       Past Medical History:  Diagnosis Date   GERD (gastroesophageal reflux disease)    LFT elevation    Vertigo     There are no problems to display for this patient.   Past Surgical History:  Procedure Laterality Date   APPENDECTOMY     CESAREAN SECTION     TUBAL LIGATION       OB History   No obstetric history on file.     No family history on file.  Social History   Tobacco Use   Smoking status: Every Day    Packs/day: 0.50    Types: Cigarettes   Smokeless tobacco: Never  Vaping Use   Vaping Use: Never used  Substance Use Topics   Alcohol use: No   Drug use: No    Home Medications Prior to Admission medications   Medication  Sig Start Date End Date Taking? Authorizing Provider  azithromycin (ZITHROMAX Z-PAK) 250 MG tablet Take 1 tablet (250 mg total) by mouth daily. 500mg  PO day 1, then 250mg  PO days 205 12/06/15   , MD  ciclopirox (PENLAC) 8 % solution APPLY OVER NAIL AND SURRONDING SKIN. APPLY DAILY OVER PREVIOUS COAT. AFTER SEVEN DAYS, MAY REMOVE WITH ALCOHOL AND CONTINUE CYCLE 12/13/20 12/13/21  Couture, Cortni S, PA-C  doxycycline (VIBRAMYCIN) 100 MG capsule TAKE 1 CAPSULE BY MOUTH TWICE DAILY FOR 7 DAYS 12/13/20 12/13/21  Couture, Cortni S, PA-C    Allergies    Codeine, Hydroxyzine hcl, Lamotrigine, Methylprednisolone, Amoxicillin-pot clavulanate, and Amphetamine-dextroamphetamine  Review of Systems   Review of Systems  Constitutional:  Negative for chills, diaphoresis and fever.  HENT:  Positive for rhinorrhea. Negative for sore throat.   Respiratory:  Positive for cough. Negative for shortness of breath.   Cardiovascular:  Negative for chest pain.  Musculoskeletal:  Negative for myalgias.  Neurological:  Negative for headaches.   Physical Exam Updated Vital Signs BP (!) 184/109 (BP Location: Left Arm)   Pulse 71   Temp 98.1 F (36.7 C) (Oral)   Resp 20   Ht 5\' 3"  (1.6 m)   Wt 90.7 kg   SpO2 98%   BMI 35.43 kg/m   Physical  Exam Vitals and nursing note reviewed.  Constitutional:      Appearance: Normal appearance.  HENT:     Head: Normocephalic and atraumatic.     Nose: Congestion present.     Mouth/Throat:     Mouth: Mucous membranes are moist.  Eyes:     General: Allergic shiner present.     Pupils: Pupils are equal, round, and reactive to light.  Pulmonary:     Effort: Pulmonary effort is normal.     Comments: 's are clear to auscultation with any wheezing, rhonchi, rales. Abdominal:     General: Abdomen is flat.  Musculoskeletal:     Cervical back: Normal range of motion and neck supple.  Skin:    General: Skin is warm and dry.  Neurological:     Mental Status: She  is alert and oriented to person, place, and time.    ED Results / Procedures / Treatments   Labs (all labs ordered are listed, but only abnormal results are displayed) Labs Reviewed - No data to display  EKG None  Radiology No results found.  Procedures Procedures   Medications Ordered in ED Medications - No data to display  ED Course  I have reviewed the triage vital signs and the nursing notes.  Pertinent labs & imaging results that were available during my care of the patient were reviewed by me and considered in my medical decision making (see chart for details).    MDM Rules/Calculators/A&P  Presents to the ED with a chief complaint of nasal congestion, puffy eyes, cough that began 1 day ago.  Long history of "seasonal allergies ", does report this is an ongoing issue for her.  Has been taking Sudafed for 1 day without any improvement in symptoms  In the ED without any fever, no hypoxia, no tachycardia.  Blood pressure slightly elevated but suspect to medication use.  For my evaluation her lungs are clear to auscultation, no wheezing,, rales.  No pain in her chest, no shortness of breath.  Oropharynx is clear with some slight erythema.  Allergic shiners are noted along with some nasal congestion.  I do not suspect infectious process at this time, we did discuss more so surgical origin.  I did discuss with her Zyrtec's or Claritin along with over-the-counter medication to help with symptomatic treatment.  If her symptoms do continue after 2 weeks with her nasal congestion and sinus pressure should she will need antibiotics.  She is aware of plan and treatment this time.  Patient is stable for discharge.   Portions of this note were generated with Scientist, clinical (histocompatibility and immunogenetics). Dictation errors may occur despite best attempts at proofreading.  Final Clinical Impression(s) / ED Diagnoses Final diagnoses:  Acute cough  Allergy, initial encounter    Rx / DC Orders ED Discharge  Orders     None        Claude Manges, PA-C 10/08/21 1342    Vanetta Mulders, MD 10/09/21 1527

## 2021-10-08 NOTE — Discharge Instructions (Addendum)
You may benefit for some over-the-counter Claritin or Zyrtec's.   If you experience any fever, shortness of breath of chest pain please return to the Emergency department.

## 2021-11-09 ENCOUNTER — Other Ambulatory Visit: Payer: Self-pay

## 2021-11-09 ENCOUNTER — Emergency Department (HOSPITAL_BASED_OUTPATIENT_CLINIC_OR_DEPARTMENT_OTHER): Payer: 59

## 2021-11-09 ENCOUNTER — Encounter (HOSPITAL_BASED_OUTPATIENT_CLINIC_OR_DEPARTMENT_OTHER): Payer: Self-pay

## 2021-11-09 ENCOUNTER — Emergency Department (HOSPITAL_BASED_OUTPATIENT_CLINIC_OR_DEPARTMENT_OTHER)
Admission: EM | Admit: 2021-11-09 | Discharge: 2021-11-09 | Disposition: A | Discharge status: Home / Self Care | Payer: 59 | Attending: Emergency Medicine | Admitting: Emergency Medicine

## 2021-11-09 DIAGNOSIS — R079 Chest pain, unspecified: Secondary | ICD-10-CM | POA: Diagnosis present

## 2021-11-09 DIAGNOSIS — R0602 Shortness of breath: Secondary | ICD-10-CM | POA: Insufficient documentation

## 2021-11-09 DIAGNOSIS — Z20822 Contact with and (suspected) exposure to covid-19: Secondary | ICD-10-CM | POA: Insufficient documentation

## 2021-11-09 DIAGNOSIS — R202 Paresthesia of skin: Secondary | ICD-10-CM | POA: Diagnosis not present

## 2021-11-09 DIAGNOSIS — F17210 Nicotine dependence, cigarettes, uncomplicated: Secondary | ICD-10-CM | POA: Insufficient documentation

## 2021-11-09 LAB — CBC WITH DIFFERENTIAL/PLATELET
Abs Immature Granulocytes: 0.01 10*3/uL (ref 0.00–0.07)
Basophils Absolute: 0 10*3/uL (ref 0.0–0.1)
Basophils Relative: 0 %
Eosinophils Absolute: 0.1 10*3/uL (ref 0.0–0.5)
Eosinophils Relative: 1 %
HCT: 42.7 % (ref 36.0–46.0)
Hemoglobin: 14.3 g/dL (ref 12.0–15.0)
Immature Granulocytes: 0 %
Lymphocytes Relative: 40 %
Lymphs Abs: 2.7 10*3/uL (ref 0.7–4.0)
MCH: 29.8 pg (ref 26.0–34.0)
MCHC: 33.5 g/dL (ref 30.0–36.0)
MCV: 89 fL (ref 80.0–100.0)
Monocytes Absolute: 0.4 10*3/uL (ref 0.1–1.0)
Monocytes Relative: 6 %
Neutro Abs: 3.5 10*3/uL (ref 1.7–7.7)
Neutrophils Relative %: 53 %
Platelets: 211 10*3/uL (ref 150–400)
RBC: 4.8 MIL/uL (ref 3.87–5.11)
RDW: 13.6 % (ref 11.5–15.5)
WBC: 6.8 10*3/uL (ref 4.0–10.5)
nRBC: 0 % (ref 0.0–0.2)

## 2021-11-09 LAB — RESP PANEL BY RT-PCR (FLU A&B, COVID) ARPGX2
Influenza A by PCR: NEGATIVE
Influenza B by PCR: NEGATIVE
SARS Coronavirus 2 by RT PCR: NEGATIVE

## 2021-11-09 LAB — COMPREHENSIVE METABOLIC PANEL
ALT: 21 U/L (ref 0–44)
AST: 20 U/L (ref 15–41)
Albumin: 4 g/dL (ref 3.5–5.0)
Alkaline Phosphatase: 100 U/L (ref 38–126)
Anion gap: 8 (ref 5–15)
BUN: 14 mg/dL (ref 6–20)
CO2: 25 mmol/L (ref 22–32)
Calcium: 8.8 mg/dL — ABNORMAL LOW (ref 8.9–10.3)
Chloride: 104 mmol/L (ref 98–111)
Creatinine, Ser: 0.62 mg/dL (ref 0.44–1.00)
GFR, Estimated: >60 mL/min (ref >60)
Glucose, Bld: 100 mg/dL — ABNORMAL HIGH (ref 70–99)
Potassium: 3.4 mmol/L — ABNORMAL LOW (ref 3.5–5.1)
Sodium: 137 mmol/L (ref 135–145)
Total Bilirubin: 0.7 mg/dL (ref 0.3–1.2)
Total Protein: 7.1 g/dL (ref 6.5–8.1)

## 2021-11-09 LAB — TROPONIN I (HIGH SENSITIVITY): Troponin I (High Sensitivity): 3 ng/L (ref <18)

## 2021-11-09 LAB — D-DIMER, QUANTITATIVE: D-Dimer, Quant: 0.27 ug/mL-FEU (ref 0.00–0.50)

## 2021-11-09 MED ORDER — ACETAMINOPHEN 325 MG PO TABS
650.0000 mg | ORAL_TABLET | Freq: Once | ORAL | Status: AC
  Administered 2021-11-09: 11:00:00 650 mg via ORAL
  Filled 2021-11-09: qty 2

## 2021-11-09 MED ORDER — CYCLOBENZAPRINE HCL 10 MG PO TABS: 10.0000 mg | ORAL_TABLET | Freq: Two times a day (BID) | ORAL | 0 refills | Status: AC | PRN

## 2021-11-09 NOTE — ED Triage Notes (Signed)
Pt c/o R sided CP that began approximately one month ago. Pt states that the pain is now on the L side and radiating to her shoulder for three days. Pt endorses SOB.

## 2021-11-09 NOTE — ED Provider Notes (Signed)
[facility] EMERGENCY DEPARTMENT Provider Note   CSN: [id number] Arrival date & time: 11/09/21  [id number]     History Chief Complaint  Patient presents with   Chest Pain    Wendy Moon is a 55 y.o. female.  The history is provided by the patient.  Chest Pain Pain location:  L chest Pain quality: aching   Pain radiates to:  Does not radiate Pain severity:  Mild Onset quality:  Gradual Duration:  3 days Timing:  Constant Progression:  Unchanged Context: movement, raising an arm and at rest   Relieved by:  Nothing Worsened by:  Nothing Associated symptoms: numbness (left hand feels warm) and shortness of breath   Associated symptoms: no abdominal pain, no anxiety, no back pain, no claudication, no cough, no diaphoresis, no dizziness, no dysphagia, no fatigue, no fever, no headache, no heartburn, no lower extremity edema, no nausea, no near-syncope, no orthopnea, no palpitations, no PND, no syncope, no vomiting and no weakness   Risk factors: no coronary artery disease, no diabetes mellitus, no high cholesterol, no hypertension, no prior DVT/PE and no smoking       Past Medical History:  Diagnosis Date   GERD (gastroesophageal reflux disease)    LFT elevation    Vertigo     There are no problems to display for this patient.   Past Surgical History:  Procedure Laterality Date   APPENDECTOMY     CESAREAN SECTION     TUBAL LIGATION       OB History   No obstetric history on file.     No family history on file.  Social History   Tobacco Use   Smoking status: Every Day    Packs/day: 0.50    Types: Cigarettes   Smokeless tobacco: Never  Vaping Use   Vaping Use: Never used  Substance Use Topics   Alcohol use: No   Drug use: No    Home Medications Prior to Admission medications   Medication Sig Start Date End Date Taking? Authorizing Provider  cyclobenzaprine (FLEXERIL) 10 MG tablet Take 1 tablet (10 mg total) by mouth 2 (two) times daily as  needed for muscle spasms. 11/09/21  Yes Devone Bonilla, DO  azithromycin (ZITHROMAX Z-PAK) 250 MG tablet Take 1 tablet (250 mg total) by mouth daily. 500mg  PO day 1, then 250mg  PO days 205 12/06/15   Noemi Chapel, MD  ciclopirox (PENLAC) 8 % solution APPLY OVER NAIL AND SURRONDING SKIN. APPLY DAILY OVER PREVIOUS COAT. AFTER SEVEN DAYS, MAY REMOVE WITH ALCOHOL AND CONTINUE CYCLE 12/13/20 12/13/21  Couture, Cortni S, PA-C  doxycycline (VIBRAMYCIN) 100 MG capsule TAKE 1 CAPSULE BY MOUTH TWICE DAILY FOR 7 DAYS 12/13/20 12/13/21  Couture, Cortni S, PA-C    Allergies    Codeine, Hydroxyzine hcl, Lamotrigine, Methylprednisolone, Amoxicillin-pot clavulanate, and Amphetamine-dextroamphetamine  Review of Systems   Review of Systems  Constitutional:  Negative for chills, diaphoresis, fatigue and fever.  HENT:  Negative for ear pain, sore throat and trouble swallowing.   Eyes:  Negative for pain and visual disturbance.  Respiratory:  Positive for shortness of breath. Negative for cough.   Cardiovascular:  Positive for chest pain. Negative for palpitations, orthopnea, claudication, syncope, PND and near-syncope.  Gastrointestinal:  Negative for abdominal pain, heartburn, nausea and vomiting.  Genitourinary:  Negative for dysuria and hematuria.  Musculoskeletal:  Negative for arthralgias and back pain.  Skin:  Negative for color change and rash.  Neurological:  Positive for numbness (left hand feels  warm). Negative for dizziness, seizures, syncope, weakness and headaches.  All other systems reviewed and are negative.  Physical Exam Updated Vital Signs  ED Triage Vitals  Enc Vitals Group     BP 11/09/21 0938 (!) 165/90     Pulse Rate 11/09/21 0938 76     Resp 11/09/21 0938 17     Temp 11/09/21 0938 98.2 F (36.8 C)     Temp Source 11/09/21 0938 Oral     SpO2 11/09/21 0938 98 %     Weight 11/09/21 0935 200 lb (90.7 kg)     Height 11/09/21 0935 5\' 3"  (1.6 m)     Head Circumference --      Peak Flow  --      Pain Score 11/09/21 0935 9     Pain Loc --      Pain Edu? --      Excl. in [place]? --     Physical Exam Vitals and nursing note reviewed.  Constitutional:      General: She is not in acute distress.    Appearance: She is well-developed. She is not ill-appearing.  HENT:     Head: Normocephalic and atraumatic.  Eyes:     Extraocular Movements: Extraocular movements intact.     Conjunctiva/sclera: Conjunctivae normal.     Pupils: Pupils are equal, round, and reactive to light.  Cardiovascular:     Rate and Rhythm: Normal rate and regular rhythm.     Pulses:          Radial pulses are 2+ on the right side and 2+ on the left side.     Heart sounds: Normal heart sounds. No murmur heard. Pulmonary:     Effort: Pulmonary effort is normal. No respiratory distress.     Breath sounds: Normal breath sounds. No decreased breath sounds or wheezing.  Chest:     Chest wall: Tenderness present.  Abdominal:     Palpations: Abdomen is soft.     Tenderness: There is no abdominal tenderness.  Musculoskeletal:        General: No swelling. Normal range of motion.     Cervical back: Normal range of motion and neck supple.     Right lower leg: No edema.     Left lower leg: No edema.  Skin:    General: Skin is warm and dry.     Capillary Refill: Capillary refill takes less than 2 seconds.  Neurological:     General: No focal deficit present.     Mental Status: She is alert and oriented to person, place, and time.     Cranial Nerves: No cranial nerve deficit.     Motor: No weakness.     Comments: 5+ out of 5 strength throughout, normal sensation, no drift, normal finger-nose-finger, normal speech  Psychiatric:        Mood and Affect: Mood normal.    ED Results / Procedures / Treatments   Labs (all labs ordered are listed, but only abnormal results are displayed) Labs Reviewed  COMPREHENSIVE METABOLIC PANEL - Abnormal; Notable for the following components:      Result Value   Potassium  3.4 (*)    Glucose, Bld 100 (*)    Calcium 8.8 (*)    All other components within normal limits  RESP PANEL BY RT-PCR (FLU A&B, COVID) ARPGX2  CBC WITH DIFFERENTIAL/PLATELET  D-DIMER, QUANTITATIVE  TROPONIN I (HIGH SENSITIVITY)    EKG None  Radiology DG Chest Portable 1 View  Result Date: 11/09/2021 CLINICAL DATA:  Chest pain EXAM: PORTABLE CHEST 1 VIEW COMPARISON:  10/24/2021 FINDINGS: The heart size and mediastinal contours are within normal limits. Both lungs are clear. The visualized skeletal structures are unremarkable. IMPRESSION: No active disease. Electronically Signed   By: Ernie Avena M.D.   On: 11/09/2021 10:03    Procedures Procedures   Medications Ordered in ED Medications  acetaminophen (TYLENOL) tablet 650 mg (650 mg Oral Given 11/09/21 1054)    ED Course  I have reviewed the triage vital signs and the nursing notes.  Pertinent labs & imaging results that were available during my care of the patient were reviewed by me and considered in my medical decision making (see chart for details).    MDM Rules/Calculators/A&P                          Wendy Moon is a 55 year old female who presents the ED with chest pain.  Pain has been there for approximately a month but more consistent for the last 3 days.  Reproducible chest pain on exam.  Normal neurologic exam.  Chest x-ray shows no pneumonia.  Troponin normal.  EKG shows sinus rhythm.  Doubt ACS.  D-dimer negative and doubt PE.  No significant anemia, electrolyte issue, kidney injury.  Pain appears to be muscular in nature.  We will have her continue Tylenol and ibuprofen.  Will prescribe Flexeril.  Discharged in good condition.  No concern for dissection or other acute cardiac or pulmonary process.  This chart was dictated using voice recognition software.  Despite best efforts to proofread,  errors can occur which can change the documentation meaning.     Final Clinical Impression(s) / ED  Diagnoses Final diagnoses:  Nonspecific chest pain    Rx / DC Orders ED Discharge Orders          Ordered    cyclobenzaprine (FLEXERIL) 10 MG tablet  2 times daily PRN        11/09/21 1055             Queen Abbett, Madelaine Bhat, DO 11/09/21 1056

## 2022-01-26 IMAGING — DX DG CHEST 1V PORT
1 series · 1 of 1 positions shown · non-contrast
Comparison: 10/24/2021

CLINICAL DATA: Chest pain

EXAM:
PORTABLE CHEST 1 VIEW

[chest ap]
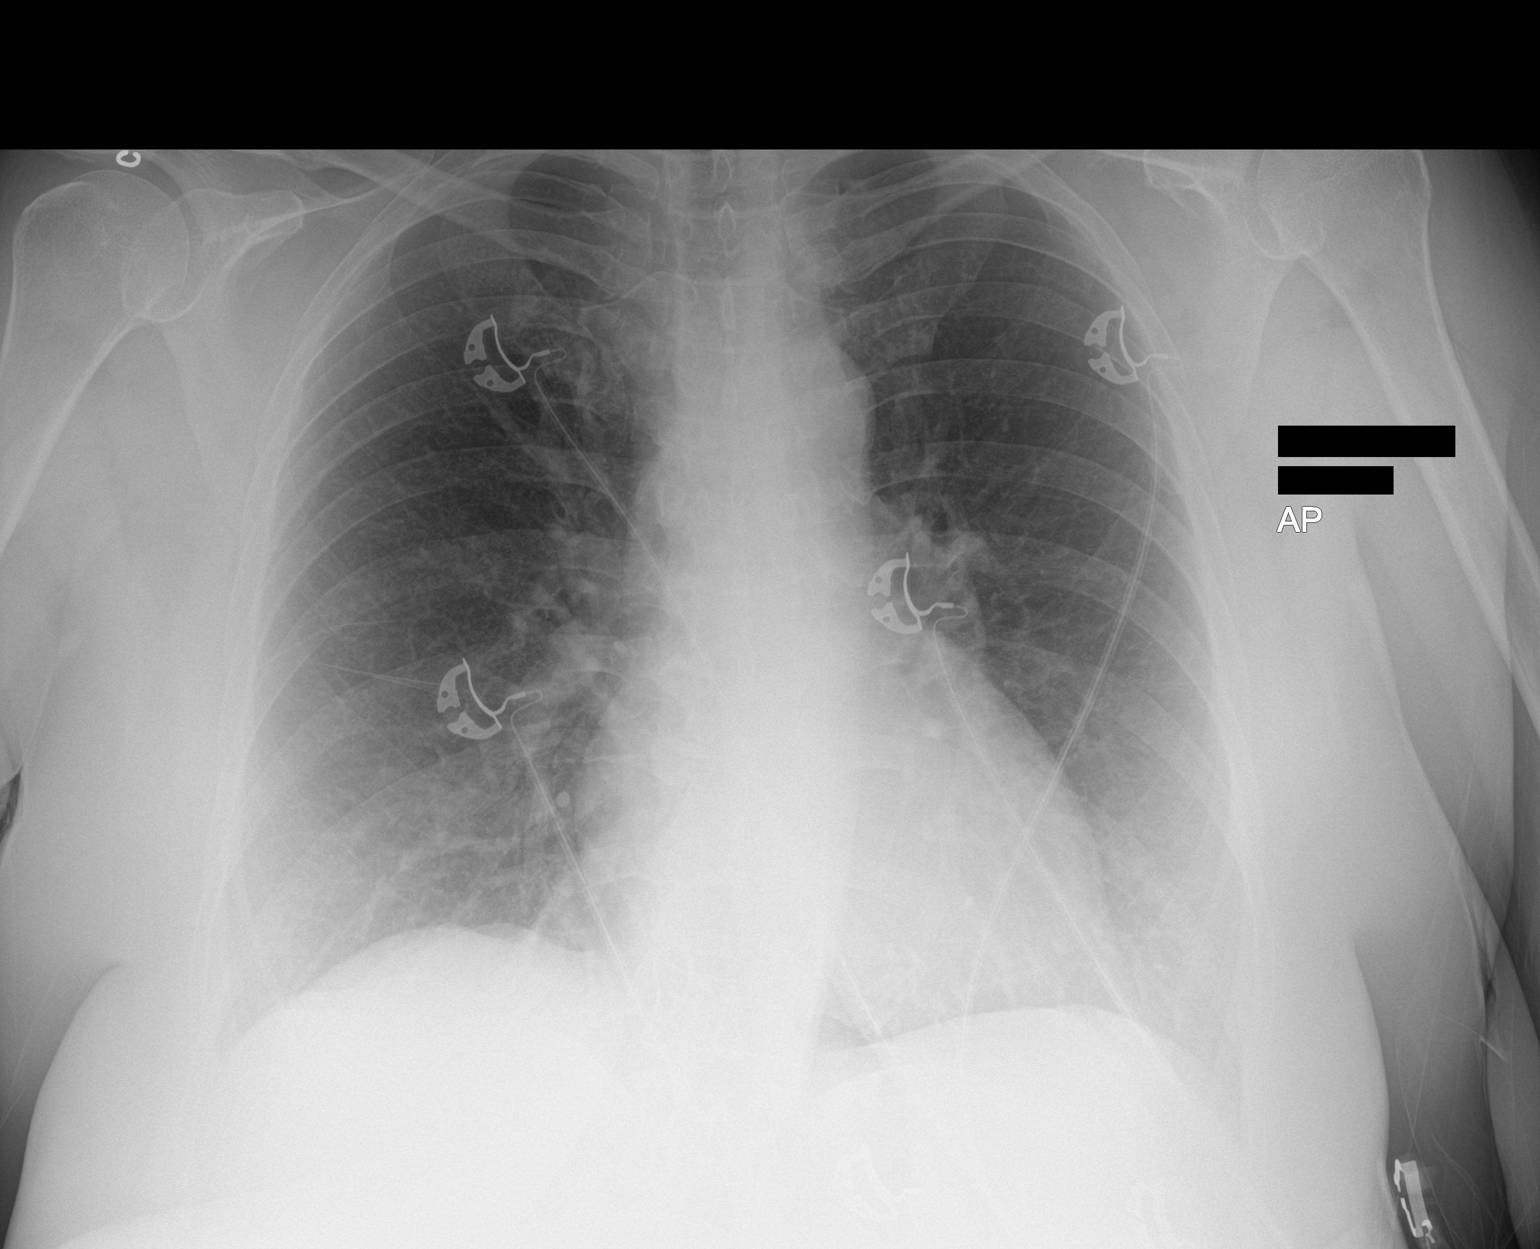

[1 of 1 positions shown; findings below may reference images not displayed]

FINDINGS: The heart size and mediastinal contours are within normal limits.
Both lungs are clear. The visualized skeletal structures are
unremarkable.
IMPRESSION: No active disease.

## 2022-02-28 ENCOUNTER — Other Ambulatory Visit: Payer: Self-pay

## 2022-02-28 ENCOUNTER — Emergency Department (HOSPITAL_BASED_OUTPATIENT_CLINIC_OR_DEPARTMENT_OTHER)
Admission: EM | Admit: 2022-02-28 | Discharge: 2022-02-28 | Disposition: A | Discharge status: Home / Self Care | Payer: 59 | Attending: Emergency Medicine | Admitting: Emergency Medicine

## 2022-02-28 ENCOUNTER — Encounter (HOSPITAL_BASED_OUTPATIENT_CLINIC_OR_DEPARTMENT_OTHER): Payer: Self-pay

## 2022-02-28 DIAGNOSIS — R590 Localized enlarged lymph nodes: Secondary | ICD-10-CM | POA: Diagnosis present

## 2022-02-28 NOTE — ED Provider Notes (Signed)
?[facility] EMERGENCY DEPARTMENT ?Provider Note ? ? ?CSN: [id number] ?Arrival date & time: 02/28/22  1349 ? ?  ? ?History ? ?Chief Complaint  ?Patient presents with  ? Lymphadenopathy  ? ? ?Wendy Moon is a 56 y.o. female. ? ?Pt complains of a swollen area in right axilla.  Pt reports she has had for over 4 months.  Pt had a mamogram in December which was normal.  Pt thinks area may have increased in size.  Pt does not have any other area of swelling no breast masses.  ? ?The history is provided by the patient. No language interpreter was used.  ? ?  ? ?Home Medications ?Prior to Admission medications   ?Medication Sig Start Date End Date Taking? Authorizing Provider  ?azithromycin (ZITHROMAX Z-PAK) 250 MG tablet Take 1 tablet (250 mg total) by mouth daily. 500mg  PO day 1, then 250mg  PO days 205 12/06/15   , MD  ?cyclobenzaprine (FLEXERIL) 10 MG tablet Take 1 tablet (10 mg total) by mouth 2 (two) times daily as needed for muscle spasms. 11/09/21   Eber Hong, DO  ?   ? ?Allergies    ?Codeine, Hydroxyzine hcl, Lamotrigine, Methylprednisolone, Amoxicillin-pot clavulanate, and Amphetamine-dextroamphetamine   ? ?Review of Systems   ?Review of Systems  ?Constitutional:  Negative for chills and fatigue.  ?All other systems reviewed and are negative. ? ?Physical Exam ?Updated Vital Signs ?BP (!) 148/78 (BP Location: Right Arm)   Pulse (!) 58   Temp 97.8 ?F (36.6 ?C) (Oral)   Resp 19   Ht 5\' 3"  (1.6 m)   Wt 89.4 kg   SpO2 98%   BMI 34.90 kg/m?  ?Physical Exam ?Vitals and nursing note reviewed.  ?Constitutional:   ?   Appearance: She is well-developed.  ?HENT:  ?   Head: Normocephalic.  ?   Nose: Nose normal.  ?   Mouth/Throat:  ?   Mouth: Mucous membranes are moist.  ?Cardiovascular:  ?   Rate and Rhythm: Normal rate and regular rhythm.  ?Pulmonary:  ?   Effort: Pulmonary effort is normal.  ?Abdominal:  ?   General: Abdomen is flat. There is no distension.  ?Musculoskeletal:     ?    General: Normal range of motion.  ?   Cervical back: Normal range of motion.  ?   Comments: Swollen tender area right axilla,    ?Neurological:  ?   Mental Status: She is alert and oriented to person, place, and time.  ? ? ?ED Results / Procedures / Treatments   ?Labs ?(all labs ordered are listed, but only abnormal results are displayed) ?Labs Reviewed - No data to display ? ?EKG ?None ? ?Radiology ?No results found. ? ?Procedures ?Procedures  ? ? ?Medications Ordered in ED ?Medications - No data to display ? ?ED Course/ Medical Decision Making/ A&P ?  ?                        ?Medical Decision Making ?Swollen lymph node right axillary area  ? ?Amount and/or Complexity of Data Reviewed ?External Data Reviewed: radiology. ?   Details: Mamogram from December reviewed ? ?Risk ?Risk Details: Ultrasound by Dr. Virgina Norfolk shows 2cm lymph node.  Pt counseled on results.  I have given pt referral information for general surgery  and breast center.  Pt is advised that she probably needs a biopsy.  She understands she needs further evaltuion  ? ? ? ? ? ? ? ? ? ? ?  Final Clinical Impression(s) / ED Diagnoses ?Final diagnoses:  ?Axillary lymphadenopathy  ? ? ?Rx / DC Orders ?ED Discharge Orders   ? ? None  ? ?  ?An After Visit Summary was printed and given to the patient.  ? ?  ?Elson Areas, [place] ?03/01/22 1512 ? ?  ?Charlynne Pander, MD ?03/01/22 2223 ? ?

## 2022-02-28 NOTE — ED Triage Notes (Signed)
Pt arrives ambulatory to ED with c/o swelling to right arm pit states that the pain is now radiating into her right arm.  ?

## 2022-02-28 NOTE — ED Notes (Signed)
Pt c/o right hand numbness that comes on with pressure to area under her right arm pit since Monday, more consistent "pins and needles" feeling today, worsening with pressure to area during exam.   ?

## 2022-04-03 ENCOUNTER — Other Ambulatory Visit: Payer: Self-pay | Admitting: Surgery

## 2022-07-02 ENCOUNTER — Encounter (HOSPITAL_BASED_OUTPATIENT_CLINIC_OR_DEPARTMENT_OTHER): Payer: Self-pay | Admitting: Emergency Medicine

## 2022-07-02 ENCOUNTER — Other Ambulatory Visit: Payer: Self-pay

## 2022-07-02 ENCOUNTER — Emergency Department (HOSPITAL_BASED_OUTPATIENT_CLINIC_OR_DEPARTMENT_OTHER)
Admission: EM | Admit: 2022-07-02 | Discharge: 2022-07-02 | Disposition: A | Discharge status: Home / Self Care | Payer: 59 | Attending: Emergency Medicine | Admitting: Emergency Medicine

## 2022-07-02 ENCOUNTER — Emergency Department (HOSPITAL_BASED_OUTPATIENT_CLINIC_OR_DEPARTMENT_OTHER): Payer: 59

## 2022-07-02 DIAGNOSIS — R0789 Other chest pain: Secondary | ICD-10-CM | POA: Diagnosis not present

## 2022-07-02 DIAGNOSIS — F17210 Nicotine dependence, cigarettes, uncomplicated: Secondary | ICD-10-CM | POA: Diagnosis not present

## 2022-07-02 LAB — CBC
HCT: 41.6 % (ref 36.0–46.0)
Hemoglobin: 13.9 g/dL (ref 12.0–15.0)
MCH: 29.5 pg (ref 26.0–34.0)
MCHC: 33.4 g/dL (ref 30.0–36.0)
MCV: 88.3 fL (ref 80.0–100.0)
Platelets: 217 10*3/uL (ref 150–400)
RBC: 4.71 MIL/uL (ref 3.87–5.11)
RDW: 13.3 % (ref 11.5–15.5)
WBC: 7.1 10*3/uL (ref 4.0–10.5)
nRBC: 0 % (ref 0.0–0.2)

## 2022-07-02 LAB — BASIC METABOLIC PANEL
Anion gap: 7 (ref 5–15)
BUN: 14 mg/dL (ref 6–20)
CO2: 23 mmol/L (ref 22–32)
Calcium: 8.9 mg/dL (ref 8.9–10.3)
Chloride: 105 mmol/L (ref 98–111)
Creatinine, Ser: 0.75 mg/dL (ref 0.44–1.00)
GFR, Estimated: >60 mL/min (ref >60)
Glucose, Bld: 92 mg/dL (ref 70–99)
Potassium: 4.3 mmol/L (ref 3.5–5.1)
Sodium: 135 mmol/L (ref 135–145)

## 2022-07-02 LAB — HEPATIC FUNCTION PANEL
ALT: 18 U/L (ref 0–44)
AST: 22 U/L (ref 15–41)
Albumin: 4.1 g/dL (ref 3.5–5.0)
Alkaline Phosphatase: 91 U/L (ref 38–126)
Bilirubin, Direct: 0.3 mg/dL — ABNORMAL HIGH (ref 0.0–0.2)
Indirect Bilirubin: 0.7 mg/dL (ref 0.3–0.9)
Total Bilirubin: 1 mg/dL (ref 0.3–1.2)
Total Protein: 7.6 g/dL (ref 6.5–8.1)

## 2022-07-02 LAB — LIPASE, BLOOD: Lipase: 25 U/L (ref 11–51)

## 2022-07-02 LAB — TROPONIN I (HIGH SENSITIVITY)
Troponin I (High Sensitivity): 3 ng/L (ref <18)
Troponin I (High Sensitivity): 3 ng/L (ref <18)

## 2022-07-02 MED ORDER — KETOROLAC TROMETHAMINE 15 MG/ML IJ SOLN
15.0000 mg | Freq: Once | INTRAMUSCULAR | Status: AC
Start: 2022-07-02 — End: 2022-07-02
  Administered 2022-07-02: 15 mg via INTRAVENOUS
  Filled 2022-07-02: qty 1

## 2022-07-02 NOTE — ED Triage Notes (Signed)
Pt having epigastric pain, started at 8 am.  No sob, no N/V, no diaphoresis, no changes with deep breath.  No cold symptoms or known fevers.  No home medications taken for pain.  Pt is alert, no acute distress noted at present.

## 2022-07-02 NOTE — ED Provider Notes (Signed)
[facility] EMERGENCY DEPARTMENT Provider Note   CSN: [id number] Arrival date & time: 07/02/22  1128     History  Chief Complaint  Patient presents with   Chest Pain    Wendy Moon is a 56 y.o. female.  56 year old female with no past medical history presents to the ED with a chief complaint of chest pain which began this morning around 8 AM.  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  Today while she was taking a shower the pain returned, pain is described as a "aching, soreness, feels like pressure "in the middle of her chest.  She returned back to work today, states that she tried doing the same job as yesterday and pain returned.  She was able to walk away for about 10 minutes and reports the pain has not dissipated.  She feels like if she takes a good deep breath she feels the pain.  However, after arrival in the ED she feels that the pain has somewhat improved.  She reports "it feels like pressure like somebody hit me in the middle ".  She does endorse tobacco use smoking 2 to 3 cigarettes daily, also uses vapes.  She does have a family history of cardiac disease but no cardiac history for patient.  Prior history of blood clots, no vomiting or fevers.    The history is provided by the patient.  Chest Pain Pain location:  Substernal area Pain quality: dull and pressure   Pain quality: not aching   Pain radiates to:  Does not radiate Pain severity:  Moderate Onset quality:  Sudden Duration:  4 hours Timing:  Constant Progression:  Partially resolved Chronicity:  New Context: lifting and movement   Context: not raising an arm and not at rest   Relieved by:  Nothing Worsened by:  Nothing Ineffective treatments:  None tried Associated symptoms: no abdominal pain, no cough, no fever, no headache, no nausea, no shortness of breath and no vomiting   Risk  factors: smoking   Risk factors: no birth control, no coronary artery disease, no diabetes mellitus, no high cholesterol, no hypertension, not female and no prior DVT/PE        Home Medications Prior to Admission medications   Medication Sig Start Date End Date Taking? Authorizing Provider  azithromycin (ZITHROMAX Z-PAK) 250 MG tablet Take 1 tablet (250 mg total) by mouth daily. 500mg  PO day 1, then 250mg  PO days 205 12/06/15   , MD  cyclobenzaprine (FLEXERIL) 10 MG tablet Take 1 tablet (10 mg total) by mouth 2 (two) times daily as needed for muscle spasms. 11/09/21   Curatolo, Adam, DO      Allergies    Codeine, Hydroxyzine hcl, Lamotrigine, Methylprednisolone, Amoxicillin-pot clavulanate, and Amphetamine-dextroamphetamine    Review of Systems   Review of Systems  Constitutional:  Negative for fever.  Respiratory:  Negative for cough and shortness of breath.   Cardiovascular:  Positive for chest pain.  Gastrointestinal:  Negative for abdominal pain, nausea and vomiting.  Genitourinary:  Negative for flank pain.  Neurological:  Negative for headaches.  All other systems reviewed and are negative.   Physical Exam Updated Vital Signs BP (!) 140/78 (BP Location: Right Arm)   Pulse 66   Temp 98.2 F (36.8 C) (Oral)   Resp 16   Ht 5\' 3"  (1.6 m)   Wt 88 kg  LMP 03/18/2013   SpO2 98%   BMI 34.37 kg/m  Physical Exam Vitals and nursing note reviewed.  Constitutional:      General: She is not in acute distress.    Appearance: She is well-developed.  HENT:     Head: Normocephalic and atraumatic.     Mouth/Throat:     Pharynx: No oropharyngeal exudate.  Eyes:     Pupils: Pupils are equal, round, and reactive to light.  Cardiovascular:     Rate and Rhythm: Regular rhythm.     Heart sounds: Normal heart sounds.  Pulmonary:     Effort: Pulmonary effort is normal. No respiratory distress.     Breath sounds: Normal breath sounds.  Abdominal:     General: Bowel  sounds are normal. There is no distension.     Palpations: Abdomen is soft.     Tenderness: There is no abdominal tenderness.  Musculoskeletal:        General: No tenderness or deformity.     Cervical back: Normal range of motion.     Right lower leg: No edema.     Left lower leg: No edema.  Skin:    General: Skin is warm and dry.  Neurological:     Mental Status: She is alert and oriented to person, place, and time.     ED Results / Procedures / Treatments   Labs (all labs ordered are listed, but only abnormal results are displayed) Labs Reviewed  HEPATIC FUNCTION PANEL - Abnormal; Notable for the following components:      Result Value   Bilirubin, Direct 0.3 (*)    All other components within normal limits  BASIC METABOLIC PANEL  CBC  LIPASE, BLOOD  TROPONIN I (HIGH SENSITIVITY)  TROPONIN I (HIGH SENSITIVITY)    EKG EKG Interpretation  Date/Time:  Thursday July 02 2022 11:43:41 EDT Ventricular Rate:  66 PR Interval:  172 QRS Duration: 102 QT Interval:  405 QTC Calculation: 425 R Axis:   67 Text Interpretation: Interpretation severely limited due to baseline artfifact Sinus rhythm Anterior infarct, old Confirmed by Vonita Moss [phone]) on 07/02/2022 2:20:09 PM  Radiology DG Chest 2 View  Result Date: 07/02/2022 CLINICAL DATA:  Chest pain EXAM: CHEST - 2 VIEW COMPARISON:  11/09/2021 FINDINGS: Cardiac size is within normal limits. There are no signs of pulmonary edema or focal pulmonary consolidation. There is no pleural effusion or pneumothorax. Increase in AP diameter of chest suggests COPD. IMPRESSION: There are no signs of pulmonary edema or focal pulmonary consolidation. Electronically Signed   By: Ernie Avena M.D.   On: 07/02/2022 12:04    Procedures Procedures    Medications Ordered in ED Medications  ketorolac (TORADOL) 15 MG/ML injection 15 mg (15 mg Intravenous Given 07/02/22 1406)    ED Course/ Medical Decision Making/ A&P                            Medical Decision Making Amount and/or Complexity of Data Reviewed Labs: ordered. Radiology: ordered.  Risk Prescription drug management.   This patient presents to the ED for concern of chest pain, this involves a number of treatment options, and is a complaint that carries with it a high risk of complications and morbidity.  The differential diagnosis includes ACS, PE, pneumonia versus MSK.    Co morbidities: Discussed in HPI   Brief History:  Patient presents to the ED with a chief complaint of substernal pain which began  this morning after taking a shower.  Had a different type of work yesterday at [employer].  Endorsing pain without any radiation.  No shortness of breath, no cough or fevers.  No prior history of cardiac disease or blood clots.  EMR reviewed including pt PMHx, past surgical history and past visits to ER.   See HPI for more details   Lab Tests:  I ordered and independently interpreted labs.  The pertinent results include:    I personally reviewed all laboratory work and imaging. Metabolic panel without any acute abnormality specifically kidney function within normal limits and no significant electrolyte abnormalities. CBC without leukocytosis or significant anemia.   Imaging Studies:  NAD. I personally reviewed all imaging studies and no acute abnormality found. I agree with radiology interpretation.    Cardiac Monitoring:  The patient was maintained on a cardiac monitor.  I personally viewed and interpreted the cardiac monitored which showed an underlying rhythm of: NSR EKG non-ischemic   Medicines ordered:  I ordered medication including toradol  for pain control Reevaluation of the patient after these medicines showed that the patient improved I have reviewed the patients home medicines and have made adjustments as needed   Reevaluation:  After the interventions noted above I re-evaluated patient and found that they  have :resolved   Social Determinants of Health:  The patient's social determinants of health were a factor in the care of this patient    Problem List / ED Course:  Patient presents to the ED with a chief complaint of chest wall pain that began this morning while she was taking a shower.  Did have a different job yesterday at [employer].  Does report pain along the substernal area.  Labs have been benign.  CBC is within normal limits for CMP without any electrolyte derangement.  She was voicing some epigastric pain to triage report her lipase along with hepatic functions were added work toward within normal limit.  Delta troponins have been negative x2.  EKG is nonischemic.  Chest x-ray without any signs of pneumonia noted.  Have a low suspicion for ACS.  Heart score 1-2 to.  Patient without any hypoxia or tachycardia to suggest pulmonary embolism.  X-ray without any signs of pneumonia.  She is stating that pain has resolved after receiving Toradol.  Very suspicious for MSK component.  Did discuss with her over-the-counter anti-inflammatories along with appropriate follow-up.  She is agreeable to plan and treatment at this time.  Patient hemodynamically stable for discharge.   Dispostion:  After consideration of the diagnostic results and the patients response to treatment, I feel that the patent would benefit from continue NSAID therapy,and PCP follow up.     Portions of this note were generated with Scientist, clinical (histocompatibility and immunogenetics). Dictation errors may occur despite best attempts at proofreading.   Final Clinical Impression(s) / ED Diagnoses Final diagnoses:  Chest wall pain    Rx / DC Orders ED Discharge Orders     None         Claude Manges, Cordelia Poche 07/02/22 1556    Rondel Baton, MD 07/04/22 1115

## 2022-07-02 NOTE — Discharge Instructions (Signed)
Your laboratory results were within normal limits.   You may benefit from anti-inflammatories to help with your pain.  Follow-up with your primary care physician as needed.

## 2022-07-02 NOTE — ED Notes (Signed)
Dc instructions reviewed with pt no questions or concerns at this time. Will follow up with pcp 

## 2023-12-31 ENCOUNTER — Emergency Department (HOSPITAL_BASED_OUTPATIENT_CLINIC_OR_DEPARTMENT_OTHER)
Admission: EM | Admit: 2023-12-31 | Discharge: 2023-12-31 | Disposition: A | Discharge status: Home / Self Care | Payer: 59

## 2023-12-31 ENCOUNTER — Encounter (HOSPITAL_BASED_OUTPATIENT_CLINIC_OR_DEPARTMENT_OTHER): Payer: Self-pay

## 2023-12-31 ENCOUNTER — Other Ambulatory Visit: Payer: Self-pay

## 2023-12-31 DIAGNOSIS — Z20822 Contact with and (suspected) exposure to covid-19: Secondary | ICD-10-CM | POA: Diagnosis not present

## 2023-12-31 DIAGNOSIS — J069 Acute upper respiratory infection, unspecified: Secondary | ICD-10-CM

## 2023-12-31 DIAGNOSIS — B349 Viral infection, unspecified: Secondary | ICD-10-CM | POA: Diagnosis not present

## 2023-12-31 DIAGNOSIS — R059 Cough, unspecified: Secondary | ICD-10-CM | POA: Diagnosis present

## 2023-12-31 HISTORY — DX: Bipolar II disorder: F31.81

## 2023-12-31 HISTORY — DX: Depression, unspecified: F32.A

## 2023-12-31 LAB — RESP PANEL BY RT-PCR (RSV, FLU A&B, COVID)  RVPGX2
Influenza A by PCR: NEGATIVE
Influenza B by PCR: NEGATIVE
Resp Syncytial Virus by PCR: NEGATIVE
SARS Coronavirus 2 by RT PCR: NEGATIVE

## 2023-12-31 NOTE — ED Notes (Signed)
Pt alert and oriented X 4 at the time of discharge. RR even and unlabored. No acute distress noted. Pt verbalized understanding of discharge instructions as discussed. Pt ambulatory to lobby at time of discharge.

## 2023-12-31 NOTE — ED Triage Notes (Signed)
In for eval of cough, headache, eyes and nose watering, head congestion, chills, sore throat, and ear ache. Took ibuprofen last pm. No tylenol or ibuprofen today. Denies nausea, vomiting, or diarrhea.

## 2023-12-31 NOTE — Discharge Instructions (Signed)
As discussed I suspect your symptoms are secondary to a virus.  There are no medications to treat viruses.  Your body must fight the infection.  You can expect symptoms to last a total of 7 to 10 days from symptom onset.  Treatment is supportive.  Please take Tylenol alternating with ibuprofen with dosages as directed on the packaging every 4 hours.  You may also take over-the-counter Mucinex.  Please follow-up with your primary doctor.  Return immediately develop fevers, chest pain, shortness of breath, lightheadedness, abdominal pain inability to drink due to nausea vomiting or any new or worsening symptoms that are concerning to you.

## 2023-12-31 NOTE — ED Provider Notes (Signed)
[place] EMERGENCY DEPARTMENT AT [facility] Provider Note   CSN: [id number] Arrival date & time: 12/31/23  1150     History  Chief Complaint  Patient presents with   Cough    Wendy Moon is a 58 y.o. female.  This is a 58 year old female presenting emergency department for viral syndrome.  Symptoms x 2 days.  Generalized malaise, congestion, rhinorrhea, cough and sore throat.  Productive cough with clear sputum.  No chest pain or shortness of breath no nausea vomiting.  No known sick contacts.   Cough      Home Medications Prior to Admission medications   Medication Sig Start Date End Date Taking? Authorizing Provider  albuterol (VENTOLIN HFA) 108 (90 Base) MCG/ACT inhaler Inhale 2 puffs into the lungs every 6 (six) hours as needed for wheezing or shortness of breath. 07/18/20  Yes [provider]  amphetamine-dextroamphetamine (ADDERALL) 10 MG tablet Take 1 tablet by mouth daily. 10/27/23  Yes [provider]  escitalopram (LEXAPRO) 20 MG tablet Take 1 tablet by mouth daily. 09/09/18  Yes [provider]  LORazepam (ATIVAN) 0.5 MG tablet Take 0.5 mg by mouth 2 (two) times daily as needed for anxiety. 10/27/23  Yes [provider]  QUEtiapine (SEROQUEL) 50 MG tablet Take 50 mg by mouth at bedtime. 12/16/22  Yes [provider]  azithromycin (ZITHROMAX Z-PAK) 250 MG tablet Take 1 tablet (250 mg total) by mouth daily. 500mg  PO day 1, then 250mg  PO days 205 12/06/15   Eber Hong, MD  cyclobenzaprine (FLEXERIL) 10 MG tablet Take 1 tablet (10 mg total) by mouth 2 (two) times daily as needed for muscle spasms. 11/09/21   Curatolo, Adam, DO      Allergies    Codeine, Hydroxyzine hcl, Lamotrigine, Methylprednisolone, Amoxicillin-pot clavulanate, and Amphetamine-dextroamphetamine    Review of Systems   Review of Systems  Respiratory:  Positive for cough.     Physical Exam Updated Vital Signs BP 131/77 (BP Location:  Right Arm)   Pulse 70   Temp 98.9 F (37.2 C)   Resp 18   Ht 5\' 3"  (1.6 m)   Wt 81.6 kg   LMP 03/18/2013   SpO2 93%   BMI 31.89 kg/m  Physical Exam Vitals and nursing note reviewed.  Constitutional:      General: She is not in acute distress.    Appearance: She is not toxic-appearing.  HENT:     Head: Normocephalic and atraumatic.     Right Ear: Tympanic membrane normal.     Left Ear: Tympanic membrane normal.     Nose: Congestion present.     Mouth/Throat:     Mouth: Mucous membranes are moist.  Eyes:     Conjunctiva/sclera: Conjunctivae normal.  Cardiovascular:     Rate and Rhythm: Normal rate.  Pulmonary:     Effort: Pulmonary effort is normal.     Breath sounds: Normal breath sounds. No wheezing, rhonchi or rales.  Abdominal:     General: Abdomen is flat. There is no distension.     Palpations: Abdomen is soft.     Tenderness: There is no abdominal tenderness. There is no guarding or rebound.  Musculoskeletal:        General: Normal range of motion.  Skin:    General: Skin is warm.  Neurological:     General: No focal deficit present.     Mental Status: She is alert.  Psychiatric:        Mood and  Affect: Mood normal.        Behavior: Behavior normal.     ED Results / Procedures / Treatments   Labs (all labs ordered are listed, but only abnormal results are displayed) Labs Reviewed  RESP PANEL BY RT-PCR (RSV, FLU A&B, COVID)  [id number]    EKG None  Radiology No results found.  Procedures Procedures    Medications Ordered in ED Medications - No data to display  ED Course/ Medical Decision Making/ A&P                                 Medical Decision Making Is a well-appearing 58 year old female presenting emergency department for viral syndrome type complaints.  She is afebrile nontachycardic hemodynamically stable.  Maintaining oxygen saturation on room air.  Does not appear to be in respiratory distress.  Lungs are clear.  Low suspicion for  acute systemic infection.  She is moving neck freely, low suspicion for meningitis.  Given constellation of symptoms and the prevalence of influenza and COVID she was tested for same.  They were negative.  I do suspect that her symptoms are viral in nature.  Discussed supportive care with Tylenol alternate with ibuprofen as well as Mucinex.  Feel she is stable for discharge at this time.  Follow-up with PCP.         Final Clinical Impression(s) / ED Diagnoses Final diagnoses:  None    Rx / DC Orders ED Discharge Orders     None         Coral Spikes, DO 12/31/23 1343
